# Patient Record
Sex: Female | Born: 1940 | Race: Black or African American | Hispanic: No | Smoking: Former smoker
Health system: Southern US, Community
[De-identification: ages and names within clinical notes are randomized; demographics above are authoritative.]

## PROBLEM LIST (undated history)

## (undated) DIAGNOSIS — I10 Essential (primary) hypertension: Secondary | ICD-10-CM

## (undated) DIAGNOSIS — M1990 Unspecified osteoarthritis, unspecified site: Secondary | ICD-10-CM

## (undated) DIAGNOSIS — R011 Cardiac murmur, unspecified: Secondary | ICD-10-CM

## (undated) DIAGNOSIS — J45909 Unspecified asthma, uncomplicated: Secondary | ICD-10-CM

## (undated) HISTORY — PX: ABDOMINAL HYSTERECTOMY: SHX81

---

## 2003-12-27 ENCOUNTER — Emergency Department: Payer: Self-pay | Admitting: Emergency Medicine

## 2005-03-14 ENCOUNTER — Emergency Department: Payer: Self-pay | Admitting: Emergency Medicine

## 2005-10-28 ENCOUNTER — Emergency Department: Payer: Self-pay | Admitting: Unknown Physician Specialty

## 2006-09-01 ENCOUNTER — Emergency Department: Payer: Self-pay | Admitting: Emergency Medicine

## 2006-09-01 ENCOUNTER — Other Ambulatory Visit: Payer: Self-pay

## 2006-09-03 ENCOUNTER — Emergency Department: Payer: Self-pay | Admitting: Emergency Medicine

## 2006-09-03 ENCOUNTER — Other Ambulatory Visit: Payer: Self-pay

## 2007-04-19 ENCOUNTER — Other Ambulatory Visit: Payer: Self-pay

## 2007-04-19 ENCOUNTER — Emergency Department: Payer: Self-pay | Admitting: Emergency Medicine

## 2007-07-04 ENCOUNTER — Ambulatory Visit: Payer: Self-pay | Admitting: Orthopedic Surgery

## 2008-08-12 ENCOUNTER — Emergency Department: Payer: Self-pay | Admitting: Unknown Physician Specialty

## 2009-07-02 ENCOUNTER — Emergency Department: Payer: Self-pay | Admitting: Emergency Medicine

## 2009-11-27 ENCOUNTER — Emergency Department: Payer: Self-pay | Admitting: Emergency Medicine

## 2010-05-30 ENCOUNTER — Emergency Department: Payer: Self-pay | Admitting: Emergency Medicine

## 2011-01-12 ENCOUNTER — Emergency Department: Payer: Self-pay | Admitting: *Deleted

## 2011-03-01 ENCOUNTER — Inpatient Hospital Stay: Payer: Self-pay | Admitting: Family Medicine

## 2011-03-01 LAB — CBC
HCT: 26.6 % — ABNORMAL LOW (ref 35.0–47.0)
HGB: 8.6 g/dL — ABNORMAL LOW (ref 12.0–16.0)
MCH: 26.9 pg (ref 26.0–34.0)
MCV: 84 fL (ref 80–100)
RBC: 3.19 10*6/uL — ABNORMAL LOW (ref 3.80–5.20)
RDW: 14.7 % — ABNORMAL HIGH (ref 11.5–14.5)
WBC: 10.9 10*3/uL (ref 3.6–11.0)

## 2011-03-01 LAB — BASIC METABOLIC PANEL
Anion Gap: 10 (ref 7–16)
Chloride: 104 mmol/L (ref 98–107)
Co2: 28 mmol/L (ref 21–32)
Creatinine: 1.05 mg/dL (ref 0.60–1.30)
EGFR (African American): >60
EGFR (Non-African Amer.): 55 — ABNORMAL LOW
Glucose: 85 mg/dL (ref 65–99)
Sodium: 142 mmol/L (ref 136–145)

## 2011-03-01 LAB — TROPONIN I: Troponin-I: 0.21 ng/mL — ABNORMAL HIGH

## 2011-03-01 LAB — HEPATIC FUNCTION PANEL A (ARMC)
Albumin: 2.9 g/dL — ABNORMAL LOW (ref 3.4–5.0)
Alkaline Phosphatase: 74 U/L (ref 50–136)
Bilirubin, Direct: 0.2 mg/dL (ref 0.00–0.20)
Total Protein: 6.6 g/dL (ref 6.4–8.2)

## 2011-03-01 LAB — CK TOTAL AND CKMB (NOT AT ARMC)
CK, Total: 37 U/L (ref 21–215)
CK-MB: <0.5 ng/mL — ABNORMAL LOW (ref 0.5–3.6)

## 2011-03-02 LAB — BASIC METABOLIC PANEL
Anion Gap: 12 (ref 7–16)
BUN: 10 mg/dL (ref 7–18)
Chloride: 105 mmol/L (ref 98–107)
Creatinine: 0.95 mg/dL (ref 0.60–1.30)
EGFR (African American): >60
EGFR (Non-African Amer.): >60
Potassium: 3.5 mmol/L (ref 3.5–5.1)
Sodium: 142 mmol/L (ref 136–145)

## 2011-03-02 LAB — CBC WITH DIFFERENTIAL/PLATELET
Basophil #: 0 10*3/uL (ref 0.0–0.1)
Eosinophil #: 0.1 10*3/uL (ref 0.0–0.7)
Eosinophil %: 1 %
HGB: 7.7 g/dL — ABNORMAL LOW (ref 12.0–16.0)
MCH: 27.1 pg (ref 26.0–34.0)
MCHC: 32.3 g/dL (ref 32.0–36.0)
Monocyte #: 1.4 10*3/uL — ABNORMAL HIGH (ref 0.0–0.7)
Neutrophil #: 5.4 10*3/uL (ref 1.4–6.5)
Neutrophil %: 60.1 %
Platelet: 231 10*3/uL (ref 150–440)
RBC: 2.83 10*6/uL — ABNORMAL LOW (ref 3.80–5.20)
WBC: 8.9 10*3/uL (ref 3.6–11.0)

## 2011-03-02 LAB — LIPID PANEL: VLDL Cholesterol, Calc: 11 mg/dL (ref 5–40)

## 2011-03-02 LAB — IRON AND TIBC
Iron Saturation: 15 %
Iron: 24 ug/dL — ABNORMAL LOW (ref 50–170)
Unbound Iron-Bind.Cap.: 133 ug/dL

## 2011-03-02 LAB — TROPONIN I: Troponin-I: 0.15 ng/mL — ABNORMAL HIGH

## 2011-03-02 LAB — MAGNESIUM: Magnesium: 1.6 mg/dL — ABNORMAL LOW

## 2011-03-02 LAB — FOLATE: Folic Acid: 6.3 ng/mL (ref 3.1–100.0)

## 2011-03-03 LAB — BASIC METABOLIC PANEL
BUN: 8 mg/dL (ref 7–18)
Chloride: 106 mmol/L (ref 98–107)
Co2: 27 mmol/L (ref 21–32)
Creatinine: 1.06 mg/dL (ref 0.60–1.30)
EGFR (Non-African Amer.): 55 — ABNORMAL LOW
Potassium: 3.7 mmol/L (ref 3.5–5.1)
Sodium: 143 mmol/L (ref 136–145)

## 2011-03-03 LAB — CBC WITH DIFFERENTIAL/PLATELET
Basophil %: 0 %
Eosinophil %: 1.9 %
HGB: 7.7 g/dL — ABNORMAL LOW (ref 12.0–16.0)
Lymphocyte #: 1.9 10*3/uL (ref 1.0–3.6)
Lymphocyte %: 25.3 %
MCH: 26.6 pg (ref 26.0–34.0)
MCHC: 31.5 g/dL — ABNORMAL LOW (ref 32.0–36.0)
MCV: 84 fL (ref 80–100)
Monocyte #: 1 10*3/uL — ABNORMAL HIGH (ref 0.0–0.7)
Monocyte %: 13.6 %
Neutrophil %: 59.2 %
RDW: 14.8 % — ABNORMAL HIGH (ref 11.5–14.5)
WBC: 7.4 10*3/uL (ref 3.6–11.0)

## 2011-03-04 LAB — BASIC METABOLIC PANEL
Anion Gap: 8 (ref 7–16)
Calcium, Total: 9 mg/dL (ref 8.5–10.1)
Chloride: 105 mmol/L (ref 98–107)
Co2: 27 mmol/L (ref 21–32)
Creatinine: 1.13 mg/dL (ref 0.60–1.30)
EGFR (Non-African Amer.): 51 — ABNORMAL LOW
Osmolality: 277 (ref 275–301)
Potassium: 3.9 mmol/L (ref 3.5–5.1)
Sodium: 140 mmol/L (ref 136–145)

## 2011-03-04 LAB — CBC WITH DIFFERENTIAL/PLATELET
Basophil #: 0 10*3/uL (ref 0.0–0.1)
Basophil %: 0 %
HCT: 24.9 % — ABNORMAL LOW (ref 35.0–47.0)
HGB: 7.9 g/dL — ABNORMAL LOW (ref 12.0–16.0)
Lymphocyte %: 23.2 %
MCV: 84 fL (ref 80–100)
Monocyte %: 11.5 %
Neutrophil #: 4.8 10*3/uL (ref 1.4–6.5)
RDW: 14.6 % — ABNORMAL HIGH (ref 11.5–14.5)
WBC: 7.6 10*3/uL (ref 3.6–11.0)

## 2011-03-07 LAB — CULTURE, BLOOD (SINGLE)

## 2011-04-14 ENCOUNTER — Ambulatory Visit: Payer: Self-pay | Admitting: Gastroenterology

## 2011-06-17 ENCOUNTER — Ambulatory Visit: Payer: Self-pay | Admitting: Gastroenterology

## 2011-11-24 ENCOUNTER — Emergency Department: Payer: Self-pay | Admitting: Emergency Medicine

## 2011-11-24 LAB — BASIC METABOLIC PANEL
Anion Gap: 9 (ref 7–16)
BUN: 17 mg/dL (ref 7–18)
Chloride: 109 mmol/L — ABNORMAL HIGH (ref 98–107)
Co2: 26 mmol/L (ref 21–32)
Creatinine: 1.1 mg/dL (ref 0.60–1.30)
Potassium: 3.9 mmol/L (ref 3.5–5.1)
Sodium: 144 mmol/L (ref 136–145)

## 2011-11-24 LAB — URINALYSIS, COMPLETE
Bilirubin,UR: NEGATIVE
Blood: NEGATIVE
Ketone: NEGATIVE
Protein: NEGATIVE
RBC,UR: 1 /HPF (ref 0–5)
Specific Gravity: 1.023 (ref 1.003–1.030)
WBC UR: 1 /HPF (ref 0–5)

## 2011-11-24 LAB — CBC
HGB: 10.9 g/dL — ABNORMAL LOW (ref 12.0–16.0)
MCH: 26.8 pg (ref 26.0–34.0)
MCHC: 33.4 g/dL (ref 32.0–36.0)
MCV: 80 fL (ref 80–100)
RBC: 4.06 10*6/uL (ref 3.80–5.20)

## 2012-08-09 ENCOUNTER — Emergency Department: Payer: Self-pay | Admitting: Emergency Medicine

## 2012-08-09 LAB — URINALYSIS, COMPLETE
Bacteria: NONE SEEN
Bilirubin,UR: NEGATIVE
Blood: NEGATIVE
Glucose,UR: NEGATIVE mg/dL (ref 0–75)
Leukocyte Esterase: NEGATIVE
Nitrite: NEGATIVE
Protein: 30
RBC,UR: 1 /HPF (ref 0–5)
Specific Gravity: 1.025 (ref 1.003–1.030)
Squamous Epithelial: 12
WBC UR: 1 /HPF (ref 0–5)

## 2012-08-09 LAB — BASIC METABOLIC PANEL
Anion Gap: 7 (ref 7–16)
BUN: 15 mg/dL (ref 7–18)
Calcium, Total: 9.1 mg/dL (ref 8.5–10.1)
Chloride: 108 mmol/L — ABNORMAL HIGH (ref 98–107)
Co2: 22 mmol/L (ref 21–32)
EGFR (Non-African Amer.): 52 — ABNORMAL LOW
Glucose: 90 mg/dL (ref 65–99)
Osmolality: 274 (ref 275–301)
Potassium: 3.6 mmol/L (ref 3.5–5.1)
Sodium: 137 mmol/L (ref 136–145)

## 2012-08-09 LAB — CBC WITH DIFFERENTIAL/PLATELET
Basophil #: 0.1 10*3/uL (ref 0.0–0.1)
Basophil %: 0.8 %
Eosinophil %: 0.6 %
HGB: 11.4 g/dL — ABNORMAL LOW (ref 12.0–16.0)
Lymphocyte #: 2.2 10*3/uL (ref 1.0–3.6)
MCV: 81 fL (ref 80–100)
Monocyte #: 0.6 x10 3/mm (ref 0.2–0.9)
Neutrophil #: 3.8 10*3/uL (ref 1.4–6.5)
Neutrophil %: 57.1 %
Platelet: 187 10*3/uL (ref 150–440)
RBC: 4.3 10*6/uL (ref 3.80–5.20)
WBC: 6.7 10*3/uL (ref 3.6–11.0)

## 2012-10-19 ENCOUNTER — Emergency Department: Payer: Self-pay | Admitting: Emergency Medicine

## 2012-12-30 ENCOUNTER — Emergency Department: Payer: Self-pay | Admitting: Emergency Medicine

## 2013-01-04 ENCOUNTER — Inpatient Hospital Stay: Payer: Self-pay | Admitting: Psychiatry

## 2013-01-04 LAB — COMPREHENSIVE METABOLIC PANEL
Albumin: 3.7 g/dL (ref 3.4–5.0)
Anion Gap: 4 — ABNORMAL LOW (ref 7–16)
Creatinine: 1.02 mg/dL (ref 0.60–1.30)
EGFR (African American): >60
EGFR (Non-African Amer.): 55 — ABNORMAL LOW
Glucose: 74 mg/dL (ref 65–99)
Osmolality: 282 (ref 275–301)
Potassium: 3.6 mmol/L (ref 3.5–5.1)
SGPT (ALT): 11 U/L — ABNORMAL LOW (ref 12–78)
Sodium: 141 mmol/L (ref 136–145)

## 2013-01-04 LAB — URINALYSIS, COMPLETE
Bacteria: NONE SEEN
Bilirubin,UR: NEGATIVE
Blood: NEGATIVE
Ketone: NEGATIVE
Nitrite: NEGATIVE
Ph: 6 (ref 4.5–8.0)
Protein: NEGATIVE
RBC,UR: <1 /HPF (ref 0–5)
Squamous Epithelial: <1

## 2013-01-04 LAB — CBC
HCT: 34.2 % — ABNORMAL LOW (ref 35.0–47.0)
MCH: 26.4 pg (ref 26.0–34.0)
RDW: 15 % — ABNORMAL HIGH (ref 11.5–14.5)
WBC: 6.3 10*3/uL (ref 3.6–11.0)

## 2013-01-04 LAB — DRUG SCREEN, URINE
Barbiturates, Ur Screen: NEGATIVE (ref ?–200)
Benzodiazepine, Ur Scrn: NEGATIVE (ref ?–200)
Cocaine Metabolite,Ur ~~LOC~~: NEGATIVE (ref ?–300)
Opiate, Ur Screen: NEGATIVE (ref ?–300)
Tricyclic, Ur Screen: NEGATIVE (ref ?–1000)

## 2013-01-04 LAB — ETHANOL: Ethanol %: 0.277 % — ABNORMAL HIGH (ref 0.000–0.080)

## 2013-01-04 LAB — TSH: Thyroid Stimulating Horm: 0.767 u[IU]/mL

## 2013-01-05 LAB — ETHANOL
Ethanol %: 0.144 % — ABNORMAL HIGH (ref 0.000–0.080)
Ethanol: 144 mg/dL

## 2013-01-26 ENCOUNTER — Inpatient Hospital Stay: Payer: Self-pay | Admitting: Internal Medicine

## 2013-01-26 LAB — BASIC METABOLIC PANEL
Anion Gap: 4 — ABNORMAL LOW (ref 7–16)
Chloride: 108 mmol/L — ABNORMAL HIGH (ref 98–107)
Creatinine: 1.08 mg/dL (ref 0.60–1.30)
EGFR (African American): 60 — ABNORMAL LOW
EGFR (Non-African Amer.): 52 — ABNORMAL LOW
Glucose: 96 mg/dL (ref 65–99)
Osmolality: 281 (ref 275–301)
Potassium: 3.8 mmol/L (ref 3.5–5.1)

## 2013-01-26 LAB — CBC WITH DIFFERENTIAL/PLATELET
Basophil %: 0.3 %
Eosinophil %: 0.6 %
Lymphocyte #: 2 10*3/uL (ref 1.0–3.6)
Lymphocyte %: 25.1 %
MCH: 26.4 pg (ref 26.0–34.0)
MCV: 81 fL (ref 80–100)
Monocyte #: 0.7 x10 3/mm (ref 0.2–0.9)
Monocyte %: 8.5 %
Neutrophil #: 5.3 10*3/uL (ref 1.4–6.5)
Neutrophil %: 65.5 %
Platelet: 169 10*3/uL (ref 150–440)
WBC: 8 10*3/uL (ref 3.6–11.0)

## 2013-01-26 LAB — URINALYSIS, COMPLETE
Bacteria: NONE SEEN
Bilirubin,UR: NEGATIVE
Leukocyte Esterase: NEGATIVE
Nitrite: NEGATIVE
Ph: 7 (ref 4.5–8.0)
Protein: NEGATIVE
RBC,UR: 1 /HPF (ref 0–5)
Specific Gravity: 1.014 (ref 1.003–1.030)
Squamous Epithelial: 4

## 2013-01-27 LAB — CK TOTAL AND CKMB (NOT AT ARMC)
CK, Total: 45 U/L (ref 21–215)
CK-MB: 0.9 ng/mL (ref 0.5–3.6)

## 2013-01-27 LAB — TROPONIN I
Troponin-I: <0.02 ng/mL
Troponin-I: <0.02 ng/mL

## 2013-01-28 LAB — BASIC METABOLIC PANEL
Anion Gap: 7 (ref 7–16)
BUN: 15 mg/dL (ref 7–18)
Calcium, Total: 8.7 mg/dL (ref 8.5–10.1)
Chloride: 105 mmol/L (ref 98–107)
Creatinine: 0.95 mg/dL (ref 0.60–1.30)
Glucose: 87 mg/dL (ref 65–99)
Osmolality: 276 (ref 275–301)

## 2013-05-08 ENCOUNTER — Emergency Department: Payer: Self-pay | Admitting: Emergency Medicine

## 2013-06-19 ENCOUNTER — Emergency Department: Payer: Self-pay | Admitting: Emergency Medicine

## 2013-08-17 ENCOUNTER — Emergency Department: Payer: Self-pay | Admitting: Emergency Medicine

## 2013-08-17 LAB — COMPREHENSIVE METABOLIC PANEL
ANION GAP: 9 (ref 7–16)
AST: 12 U/L — AB (ref 15–37)
Albumin: 3.5 g/dL (ref 3.4–5.0)
Alkaline Phosphatase: 74 U/L
BUN: 14 mg/dL (ref 7–18)
Bilirubin,Total: 0.8 mg/dL (ref 0.2–1.0)
CHLORIDE: 104 mmol/L (ref 98–107)
CO2: 26 mmol/L (ref 21–32)
Calcium, Total: 8.8 mg/dL (ref 8.5–10.1)
Creatinine: 0.88 mg/dL (ref 0.60–1.30)
EGFR (Non-African Amer.): >60
Glucose: 72 mg/dL (ref 65–99)
Osmolality: 277 (ref 275–301)
POTASSIUM: 3.4 mmol/L — AB (ref 3.5–5.1)
SGPT (ALT): 11 U/L — ABNORMAL LOW (ref 12–78)
Sodium: 139 mmol/L (ref 136–145)
TOTAL PROTEIN: 6.9 g/dL (ref 6.4–8.2)

## 2013-08-17 LAB — TROPONIN I: Troponin-I: <0.02 ng/mL

## 2013-08-17 LAB — CBC WITH DIFFERENTIAL/PLATELET
Basophil #: 0.1 10*3/uL (ref 0.0–0.1)
Basophil %: 0.8 %
EOS ABS: 0.1 10*3/uL (ref 0.0–0.7)
Eosinophil %: 1.1 %
HCT: 31.7 % — ABNORMAL LOW (ref 35.0–47.0)
HGB: 10.5 g/dL — ABNORMAL LOW (ref 12.0–16.0)
LYMPHS ABS: 1.4 10*3/uL (ref 1.0–3.6)
Lymphocyte %: 14.8 %
MCH: 26.9 pg (ref 26.0–34.0)
MCHC: 33 g/dL (ref 32.0–36.0)
MCV: 81 fL (ref 80–100)
MONOS PCT: 7.3 %
Monocyte #: 0.7 x10 3/mm (ref 0.2–0.9)
NEUTROS PCT: 76 %
Neutrophil #: 7.4 10*3/uL — ABNORMAL HIGH (ref 1.4–6.5)
Platelet: 180 10*3/uL (ref 150–440)
RBC: 3.9 10*6/uL (ref 3.80–5.20)
RDW: 14.1 % (ref 11.5–14.5)
WBC: 9.7 10*3/uL (ref 3.6–11.0)

## 2013-11-04 ENCOUNTER — Emergency Department: Payer: Self-pay | Admitting: Student

## 2013-11-11 ENCOUNTER — Emergency Department: Payer: Self-pay | Admitting: Emergency Medicine

## 2013-11-11 LAB — COMPREHENSIVE METABOLIC PANEL
ALK PHOS: 96 U/L
ALT: 11 U/L — AB
ANION GAP: 7 (ref 7–16)
Albumin: 3.2 g/dL — ABNORMAL LOW (ref 3.4–5.0)
BILIRUBIN TOTAL: 0.7 mg/dL (ref 0.2–1.0)
BUN: 17 mg/dL (ref 7–18)
CALCIUM: 8.3 mg/dL — AB (ref 8.5–10.1)
CHLORIDE: 105 mmol/L (ref 98–107)
CO2: 29 mmol/L (ref 21–32)
CREATININE: 1.05 mg/dL (ref 0.60–1.30)
GFR CALC NON AF AMER: 53 — AB
Glucose: 103 mg/dL — ABNORMAL HIGH (ref 65–99)
Osmolality: 283 (ref 275–301)
Potassium: 3.6 mmol/L (ref 3.5–5.1)
SGOT(AST): 15 U/L (ref 15–37)
Sodium: 141 mmol/L (ref 136–145)
TOTAL PROTEIN: 6.7 g/dL (ref 6.4–8.2)

## 2013-11-11 LAB — CBC
HCT: 34 % — AB (ref 35.0–47.0)
HGB: 10.5 g/dL — ABNORMAL LOW (ref 12.0–16.0)
MCH: 24.9 pg — ABNORMAL LOW (ref 26.0–34.0)
MCHC: 30.9 g/dL — ABNORMAL LOW (ref 32.0–36.0)
MCV: 80 fL (ref 80–100)
Platelet: 198 10*3/uL (ref 150–440)
RBC: 4.23 10*6/uL (ref 3.80–5.20)
RDW: 14.6 % — ABNORMAL HIGH (ref 11.5–14.5)
WBC: 6.3 10*3/uL (ref 3.6–11.0)

## 2013-11-11 LAB — URIC ACID: Uric Acid: 5.7 mg/dL (ref 2.6–6.0)

## 2014-06-15 NOTE — Discharge Summary (Signed)
PATIENT NAME:  Amber Vasquez, Amber Vasquez MR#:  [id number] DATE OF BIRTH:  22-Jun-1940  DATE OF ADMISSION:  01/04/2013 DATE OF DISCHARGE:  01/06/2013  HOSPITAL COURSE:  See dictated history and physical for details of admission.  A 74 year old woman with a history of no prior mental health treatment who was admitted after coming to the Emergency Room intoxicated and at the time making suicidal statements.  After sobering up the patient completely denied any suicidal ideation.  She claimed that she could not remember making any of the wild statements that she had made in the Emergency Room.  She minimized the degree that her drinking was a problem.  She did not show any signs of alcohol withdrawal.  She was not tachycardic, shaky, did not have a seizure.  Did not appear delirious.  She was not interested in inpatient alcohol abuse treatment and again she tried to minimize her alcohol use problem.  She completely denied other psychiatric symptoms.  She was referred however to outpatient follow[facility] with the advice that she seek counseling about her mood stability.  She was not started on any psychiatric medicine.  One medical point was that her blood pressure remained extremely high throughout her hospital stay.  This did not seem to me to be a symptom of alcohol withdrawal.  She gave a history of hypertension and said she had stopped taking her medicine years ago because she thought she was told she did not need them anymore.  I suspect she has been walking around with a high blood pressure ever since then.  She was put on metoprolol and also a clonidine patch, but still had high blood pressure, especially the systolic number.  She was counseled about the risks of high blood pressure and encouraged to follow up with her primary care doctor and she said she would.   MENTAL STATUS EXAMINATION AT DISCHARGE:  Casually dressed, neatly groomed woman, looks her stated age, cooperative with the interview.  Good  eye contact, normal psychomotor activity.  Speech normal rate, tone and volume.  Affect euthymic, reactive, appropriate.  Mood stated as fine.  Thoughts lucid.  No evidence of loosening of associations or delusions.  Denies auditory or visual hallucinations.  Denies suicidal or homicidal ideation.  Shows adequate and somewhat improved judgment and insight.  Normal intelligence.  Alert and oriented x 4.   DISCHARGE MEDICATIONS:  Catapres 0.1 mg in 24 hour transdermal film one patch per week.  Metoprolol 25 mg once per day.   LABORATORY RESULTS:  Chemistry panel showed just a slightly elevated chloride and low ALT.  Hemoglobin and hematocrit slightly low.  Cervical spine CT and head CT both normal.  Drug screen negative.  Alcohol level 277 on admission.  Thyroid test within normal limits.   DIAGNOSIS, PRINCIPAL AND PRIMARY:  AXIS I:  Alcohol abuse.   SECONDARY DIAGNOSES: AXIS I:  Substance-induced mood disorder.  AXIS II:  Deferred.  AXIS III:  Hypertension.  AXIS IV:  Severe from taking care of her family.  AXIS V:  Functioning at time of discharge 56.     ____________________________ Audery AmelJohn T. Elmor Kost, MD jtc:ea D: 01/06/2013 22:34:24 ET T: 01/07/2013 02:06:12 ET JOB#: [id number]  cc: Audery AmelJohn T. Chanell Nadeau, MD, <Dictator> Audery AmelJOHN T Jacqulin Brandenburger MD ELECTRONICALLY SIGNED 01/08/2013 18:50

## 2014-06-15 NOTE — H&P (Signed)
PATIENT NAME:  Amber Vasquez, CLOSSON MR#:  [id number] DATE OF BIRTH:  11-11-1940  DATE OF ADMISSION:  01/26/2013  REFERRING PHYSICIAN: Dr. Sharma Covert.   PRIMARY CARE PHYSICIAN: Dr. Burnadette Pop.  CHIEF COMPLAINT: Headache.   A 74 year old [place] American female, with past medical history of hypertension, is presenting with headache. She describes this as an intermittent headache, lasting 2 to 3 days in duration, though progressively worsening and worse today. She previously described this headache as a global sensation; no relief with rest. Today, she found no relieving factors. She describes her headache as global and nonradiating, 6 out of 10 in intensity, sharp, no relieving or worsening factors, with associated lightheadedness, blurred vision and chest pain. She described the chest pain as over the left side and burning sensation without associated shortness of breath, palpitations, nausea or diaphoresis. Given her symptoms, she checked her blood pressure and noted systolic blood pressure to be around 209 with diastolic in the high 90s to 100. With the above symptoms and elevated blood pressure, she presented to the hospital for further workup and evaluation. Of note, she has taken her metoprolol today at around 2:00 p.m.  She was previously prescribed a clonidine patch, however, has not been taking it secondary to financial issues. Currently, she is without complaints.     REVIEW OF SYSTEMS:   CONSTITUTIONAL: Denies fever, fatigue, weakness, pain.  EYES: Denies current blurred vision, double vision, eye pain.  EARS, NOSE, THROAT: Denies tinnitus, ear pain, hearing loss.  RESPIRATORY: Denies cough, wheeze, shortness of breath.  CARDIOVASCULAR: Denies chest pain, palpitations, edema.  GASTROINTESTINAL:  Denies nausea, vomiting, diarrhea, abdominal pain.  GENITOURINARY: Denies dysuria or hematuria.  ENDOCRINE: Denies nocturia or polyuria.  HEMATOLOGIC AND LYMPHATICS: Denies easy bruising or bleeding.  SKIN:  Denies any rash or lesion.  MUSCULOSKELETAL: Denies any pain in her neck, back, shoulders, knees or hips or any arthritic symptoms.  NEUROLOGIC: Positive for headache but has greatly diminished. Denies any paralysis or paresthesias.  PSYCHIATRIC: Denies any anxiety or depressive symptoms. Otherwise, full review of systems performed by me is negative.   PAST MEDICAL HISTORY: Hypertension and alcohol abuse.   SOCIAL HISTORY: Positive for tobacco usage, smoking less than 1 pack daily, as well as alcohol use, drinking 1 to 2 drinks of gin daily.   FAMILY HISTORY: Positive for hypertension and coronary artery disease.   ALLERGIES: No known allergies.   HOME MEDICATIONS: Include metoprolol 25 mg extended release p.o. daily.   PHYSICAL EXAMINATION: VITAL SIGNS: Temperature 98.1, heart rate 76, respirations 20, blood pressure on arrival 228/95, saturating 100% on room air. Current blood pressure 170/86. Weight 59 kg, BMI 19.2.  GENERAL: Well-nourished, well-developed, African American female who is currently in no acute distress.  HEAD: Normocephalic, atraumatic.  EYES:  Pupils equal, round and reactive to light. Extraocular muscles intact. No scleral icterus.  MOUTH: Moist mucous membranes. Dentition intact. No abscess noted.  EAR, NOSE, THROAT:  Clear without exudates. No external lesions.  NECK: Supple. No thyromegaly. No nodules. No JVD.  PULMONARY: Clear to auscultation bilaterally. No wheezes, rales or rhonchi. No use of accessory muscles. Good respiratory effort.  CHEST: Nontender to palpation.  CARDIOVASCULAR: S1, S2, regular rate and rhythm. No murmurs, rubs or gallops. No edema. Pedal pulses 2+ bilaterally.  GASTROINTESTINAL: Soft, nontender, nondistended. No masses. Positive bowel sounds. No hepatosplenomegaly.  MUSCULOSKELETAL: No swelling, clubbing or edema. Range of motion full in all extremities.  NEUROLOGIC: Cranial nerves II through XII intact. Sensation intact. Reflexes intact.  Pronator drift within normal limits. Gait deferred at this time. Strength is 5/5 in all extremities, including proximal and distal flexion and extension.  SKIN: No ulcerations, lesions, rashes or cyanosis. Skin warm, dry. Turgor is intact.  PSYCHIATRIC:  Mood and affect is within normal limits. Awake, alert and oriented x 3.  Insight and judgment intact.   LABORATORY DATA: Sodium 140, potassium 3.8, chloride 108, bicarb 28, BUN 18, creatinine 1.08, glucose 96. Troponin I less than 0.02. WBC 8, hemoglobin 11.4, platelets of 169. Urinalysis negative for evidence of infection. CT head performed revealing no acute intracranial process. EKG performed, normal sinus rhythm, heart rate 72. Minimal voltage criteria for left ventricular hypertrophy.   ASSESSMENT AND PLAN: A 74 year old [place] American female with history of hypertension, who has presented with intermittent headache for 2 to 3 days, found to be in hypertensive urgency.  1.  Hypertensive urgency, started on nitroglycerin drip in the Emergency Department. Her symptoms have improved. We will continue nitroglycerin drip for goal systolic blood pressure of less than 180, then wean off.  She will need additional p.o. agents.  She  is unable to afford her clonidine patch. We will start hydrochlorothiazide.  2.  Chest pain. EKG and enzymes within normal limits. We will trend cardiac enzymes. She has been given aspirin thus far.  3.  Anemia, normocytic. No indicator for transfusion at this time.  4.  Deep venous thrombosis prophylaxis with heparin subQ.   The patient is FULL CODE.   TIME SPENT: 45 minutes.    ____________________________ Cletis Athensavid K. Aarthi Uyeno, MD dkh:dmm D: 01/26/2013 20:35:00 ET T: 01/26/2013 20:50:13 ET JOB#: [id number]  cc: Cletis Athensavid K. Terricka Onofrio, MD, <Dictator> Kiwanna Spraker Synetta ShadowK Cruz Devilla MD ELECTRONICALLY SIGNED 01/27/2013 2:39

## 2014-06-15 NOTE — H&P (Signed)
PATIENT NAME:  Amber Vasquez, Amber Vasquez MR#:  [id number] DATE OF BIRTH:  Aug 28, 1940  DATE OF ADMISSION:  01/04/2013  DATE OF CONSULTATION: 01/05/2013  IDENTIFYING INFORMATION AND CHIEF COMPLAINT: A 74 year old woman came to the Emergency Room last night, intoxicated, making suicidal statements.   CHIEF COMPLAINT: Today, "I've had too much to drink and said something stupid."   HISTORY OF PRESENT ILLNESS: Information obtained from the patient and the chart. According to the chart when she came to the Emergency Room last night, she was intoxicated with a blood alcohol level almost 300. She was quoted as saying that she wanted to kill herself and that she was upset because there was somebody at work who was treating her badly.   Today, the patient tells me that she had a little bit more to drink than usual yesterday because she got off work early. She got into an argument with her boyfriend and says that she then made some off-the-cuff statement about just going ahead and killing herself. She totally denies that she was having any actual thought about killing herself. She claims to not be able to remember anything that she said in the Emergency Room as far as suicide. She admits that there is a person at work who has been of some annoyance to her but totally denies that it is a big stress for her or that it causes her to have any thoughts about killing herself. Denies that her mood is depressed consistently, denies sleep problems, appetite problems, hopelessness, or helplessness. She claims that her alcohol use is about a drink or two every few days, although last night, she said that she was a "functioning alcoholic." Today she denies even having that much of an alcohol problem and says no one has ever told her that her alcohol use was an issue in the past.   PAST PSYCHIATRIC HISTORY: Never been in the hospital for detox before. Says she has never gotten any treatment for alcohol abuse. No psychiatric treatment of  any sort. Not on antidepressant medicine. Denies any history of suicide attempts or psychotic symptoms.   PAST MEDICAL HISTORY: The patient has had a colonoscopy and has had pneumonia in the past. She has hypertension but says she was told to stop taking her medicine some time ago and so she no longer takes medicine for it.   SUBSTANCE ABUSE HISTORY: She minimizes this completely. Claims that she drinks only a little bit and that it has not been impairing to her in any way. Denies any history of seizures or withdrawal. Says no one has ever suggested to her that it was a problem.   SOCIAL HISTORY: Lives alone but has a boyfriend who visits her regularly. 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 Has family support. She does have a lot of stress in her life in the form of taking care of her 73 year old father and also having a son who apparently has a terminal cancer and is having to live in a facility of some sort, so that she feels responsible for both of them. She has grandchildren she sees regularly. The son who is evidently dying is her only child.   CURRENT MEDICATIONS: She denies that she has been taking anything. She cannot remember what the blood pressure medicine that she used to take was.   ALLERGIES: No known drug allergies.   REVIEW OF SYSTEMS: Today she denies depression. Denies suicidal ideation. Denies hallucinations. Denies shakiness or nausea. Denies having any symptoms essentially at all.  FAMILY HISTORY: Denies any history of substance abuse or mental health problems in her family.   MENTAL STATUS EXAMINATION: Elderly woman, looks older than her stated age. Cooperative with the interview. Eye contact a little skittish. Psychomotor activity normal. No sign of a tremor. Speech normal in rate, tone and volume. Affect is slightly anxious. Speech is normal rate, tone and volume. She very much is dismissive of any symptoms. Insight possibly impaired. Judgment currently improved. Intelligence  normal. No sign of obvious dementia.   PHYSICAL EXAMINATION:  GENERAL: Quite thin. She has diffusely dry skin. No acute lesions. She has poor dentition. Oral mucosa is dry.  HEENT: Pupils equal and reactive. Face symmetric.  MUSCULOSKELETAL: Full range of motion at all extremities. Gait within normal limits. Strength normal throughout. Reflexes normal throughout. Cranial nerves symmetric and normal.  LUNGS: Clear with no wheezes.  HEART: Regular rate and rhythm.  ABDOMEN: Soft, nontender, normal bowel sounds.  CURRENT VITAL SIGNS: Now blood pressure of 211/82, respirations 18, pulse 63, temperature 97.7.   LABORATORY RESULTS: Alcohol level in the Emergency Room last night, 277, came down by later last night to 144. Drug screen negative. TSH normal. Chemistry panel shows a low ALT, slightly elevated chloride. No other abnormalities.   Hematology panel: Low hematocrit and hemoglobin. Normal platelet count.   Urinalysis unremarkable.   ASSESSMENT: This is a 74 year old woman who was intoxicated and made suicidal statements. Today she is denying suicidal ideation and minimizing her drinking. She is not showing signs of withdrawal and her labs do not look particularly abnormal, all of which suggests that she may not have a very heavy alcohol problem; nevertheless, obviously it is a problem if she is voicing this kind of suicidal ideation.   TREATMENT PLAN: Worked on trying to improve her insight. Supportive and educational therapy. Reviewed medications. Reviewed blood pressure, especially. Encouraged her to attend groups. Continue detox protocol.   DIAGNOSIS, PRINCIPAL AND PRIMARY:  AXIS I: Alcohol abuse.   SECONDARY DIAGNOSES:  AXIS I: Substance-induced mood disorder.  AXIS II: Deferred.  AXIS III: Hypertension.  AXIS IV: Severe stress from her sick family.  AXIS V: Functioning at time of evaluation, 50.    ____________________________ Audery Amel, MD jtc:np D: 01/05/2013 17:39:27  ET T: 01/05/2013 19:24:53 ET JOB#: [id number]  cc: Audery Amel, MD, <Dictator> Audery Amel MD ELECTRONICALLY SIGNED 01/05/2013 23:19
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
## 2014-06-17 NOTE — Consult Note (Signed)
Brief Consult Note: Diagnosis: pneumonia.   Patient was seen by consultant.   Consult note dictated.   Discussed with Attending MD.   Comments: Appreciate consult for 74 y/o [place] woman for hemoptysis and anemia with abdominal pain today. Does relate history of epigastric discomfort, nausea, early satiety since last Thanksgiving. Reports weight loss of 20lb since then and 2 episodes of black tarry stools on last Thursday and Saturday. Has used BC powders q am for the last 2 years, no PPI or H2RA use at home. No history of colonscopy or EGD. Stool brown and heme negative on exam today. Assessment: likely blood loss from NSAID related GI bleeding. Would like to do EGD on patient- but will need cardiac clearance first due to recent EKG changes and slight troponin elevation. Dr Marva PandaSkulskie discussed this with Dr Burnadette PopLinthavong who is to contact cardiology  Electronic Signatures: Keturah BarreLondon, Chelsei Mcchesney H (NP)  (Signed 08-Jan-13 17:19)  Authored: Brief Consult Note   Last Updated: 08-Jan-13 17:19 by Keturah BarreLondon, Avyukt Cimo H (NP)

## 2014-06-17 NOTE — Discharge Summary (Signed)
PATIENT NAME:  Amber Vasquez, Neida M MR#:  [id number] DATE OF BIRTH:  [birth year]  DATE OF ADMISSION:  03/01/2011 DATE OF DISCHARGE:  03/04/2011  DISCHARGE DIAGNOSES:  1. Right-sided pneumonia.  2. Normocytic anemia.  3. Hypertension.   DISCHARGE MEDICATIONS:  1. Metoprolol 25 mg 1/2 tab p.o. twice a day.  2. Albuterol 90 mcg inhaler 2 puffs q.4 hours p.r.n. for wheezing.  3. Cheratussin AC 100/10 10 mL p.o. q.6 hours p.r.n. for cough.  4. Omeprazole 20 mg p.o. b.i.d.  5. Levaquin 500 mg p.o. daily x4 more days.  STOP MEDICATIONS: Do not take any NSAIDs.   CONSULTS: Cardiology per Dr. Gwen PoundsKowalski and GI per Dr. Marva PandaSkulskie.   PROCEDURES: The patient underwent an echocardiogram that showed normal LV function.   PERTINENT LABS ON DAY OF DISCHARGE: Patient's sodium was 140, potassium 3.9, BUN 8, creatinine 1.13. Hemoglobin was 7.9 with MCV of 84. White blood cell count 7.6, platelet count 335.   BRIEF HOSPITAL COURSE:  1. Right-sided pneumonia. The patient came in with acutely worsening cough and shortness of breath. Chest x-ray consistent with right side pneumonia. She was placed on IV Levaquin and breathing treatments as needed. She was transitioned over to orals after 48 hours being afebrile with a normal white blood cell count. She tolerated the medication fine without any issues.   2. Normocytic anemia. The patient was noted to be quite anemic with a hemoglobin below 8. She admits to having a history of chronic NSAID use. She was placed on PPI. She had an isolated episode of hemoptysis thought to be more due to the pneumonia and has not had any further issues since that episode. Denies any melena, hematochezia, or abdominal pain at this point. Dr. Marva PandaSkulskie was consulted who did recommend an EGD and colonoscopy but will hold off on this until the pneumonia is treated for at least two more days. The plan is to discharge her and have her see Dr. Marva PandaSkulskie and do a further work-up as an outpatient.  Cardiology was consulted because of her elevated troponins prior to doing any invasive procedure. Dr. Marva PandaSkulskie did not think that any further cardiac intervention was needed. He did not think that her elevated troponin was due to myocardial infarction. It was more due to ischemic demand.   3. Hypertension. She had been off her medications for quite some time. She came in with elevated blood pressure. Since we restarted her metoprolol her blood pressure has returned to normal. She will continue on the metoprolol as prescribed.   DISPOSITION: She is in stable condition to be discharged home. Follow-up with Dr. Marva PandaSkulskie in one week. Follow-up with Dr. Burnadette PopLinthavong in 1 to 2 weeks. She will need a recheck hemoglobin at that visit with Dr. Burnadette PopLinthavong.   ____________________________ Marisue IvanKanhka Tiya Schrupp, MD kl:rbg D: 03/04/2011 07:40:51 ET T: 03/05/2011 12:51:36 ET JOB#: [id number]  cc: Marisue IvanKanhka Sanaai Doane, MD, <Dictator> Marisue IvanKANHKA Destanee Bedonie MD ELECTRONICALLY SIGNED 03/10/2011 8:32

## 2014-06-17 NOTE — Consult Note (Signed)
Brief Consult Note: Diagnosis: known cad risk factors with acute anemia and bleeding as well as increase dyspnea and no current evidence of chest pain or true mi  Ehco with normal lv function elevated troponin suggests demand ischemia.   Patient was seen by consultant.   Consult note dictated.   Comments: continue supportive care for anemia and bleeding no further cardiac diagnositcs needed at this time continue medical mgt of cardiac risk factors proceed to gi workup at this time.  Electronic Signatures: Lamar BlinksKowalski, Osei Anger J (MD)  (Signed 08-Jan-13 17:07)  Authored: Brief Consult Note   Last Updated: 08-Jan-13 17:07 by Lamar BlinksKowalski, Ibtisam Benge J (MD)

## 2014-06-17 NOTE — Consult Note (Signed)
PATIENT NAME:  Amber Vasquez, Amber Vasquez MR#:  [id number] DATE OF BIRTH:  06/29/1940  DATE OF CONSULTATION:  03/08/2011  REFERRING PHYSICIAN:  Malachy Moanevainder Goli, MD CONSULTING PHYSICIAN:  Lamar BlinksBruce J. Kowalski, MD  REASON FOR CONSULTATION: Chest pain with elevated troponin.   HISTORY OF PRESENT ILLNESS: This is a 74 year old female with past history of hypertension who reports that she has had some higher blood pressures after running out of her blood pressure medication. She has had some cough and phlegm for the last month but no evidence of temperature or other issues. She has been having some chest type pain radiating into her back and into her abdomen with concerns of cardiovascular disease. She has had minimal elevation of troponin more consistent with demand ischemia rather than true acute myocardial infarction. The patient has had a significant history of alcohol use for which she claims that she is going to cut back. The patient has now been admitted for some cough, possible pneumonia, anemia, and hypertension. She is comfortable at this time on appropriate medications and antibiotics.   REVIEW OF SYSTEMS: The remainder review of systems is negative for vision change, ringing in the ears, hearing loss, cough, congestion, heartburn, nausea, vomiting, diarrhea, bloody stools, stomach pain, extremity pain, leg weakness, cramping of the buttocks, known blood clots, headaches, blackouts, dizzy spells, nosebleeds, congestion, trouble swallowing, frequent urination, urination at night, muscle weakness, numbness, anxiety, depression, skin lesions, or skin rashes.   PAST MEDICAL HISTORY:  1. Hypertension.  2. Hysterectomy.  FAMILY HISTORY: Mother had heart disease and hypertension.   SOCIAL HISTORY: She smokes cigarettes 1/2 pack per day and is drinking alcohol as well. She   DRUG ALLERGIES: No known drug allergies.  CURRENT MEDICATIONS: As listed.   PHYSICAL EXAMINATION:   VITAL SIGNS: Blood pressure 126/68  bilaterally and heart rate 72 upright, reclining, and regular.   GENERAL: She is a well appearing female in no acute distress.   HEENT: No icterus, thyromegaly, ulcers, hemorrhage, or xanthelasma.   HEART: Regular rate and rhythm. Normal S1 and S2 without murmur, gallop, or rub. Point of maximal impulse is normal size and placement. Carotid upstroke is normal without bruit. Jugular venous pressure is normal.   LUNGS: Clear to auscultation with normal respirations.   ABDOMEN: Soft and nontender without hepatosplenomegaly or masses. Abdominal aorta is normal size without bruit.   EXTREMITIES: 2+ bilateral pulses in dorsal, pedal, radial, and femoral arteries without lower extremity edema, cyanosis, clubbing, or ulcers.   NEUROLOGIC: She is oriented to time, place, and person with normal mood and affect.   ASSESSMENT: This is a 74 year old female with hypertension and elevated troponin consistent with demand ischemia, pneumonia, and anemia possibly secondary to tobacco use.   RECOMMENDATIONS:  1. No further cardiac diagnostics are necessary at this time due to no evidence of acute myocardial infarction. 2. Ambulate and follow for any worsening symptoms.  3. Continue hypertension control with beta blocker if able for heart rate and blood pressure control.         4. Further investigation of possible anemia and GI bleed as well as supportive treatment of pneumonia. ____________________________ Lamar BlinksBruce J. Kowalski, MD bjk:slb D: 03/12/2011 12:19:08 ET T: 03/12/2011 12:43:57 ET JOB#: [id number]  cc: Lamar BlinksBruce J. Kowalski, MD, <Dictator> Lamar BlinksBRUCE J KOWALSKI MD ELECTRONICALLY SIGNED 04/10/2011 14:26

## 2014-06-17 NOTE — Consult Note (Signed)
Chief Complaint:   Subjective/Chief Complaint please see full GI consult.  patient admitted with cough and  pneumonia, found with marked anemia.  Recommend egd and colonoscopy when clinically feasible.  Ecg changes form previous noted, with abnormal cardiac enzymes.  Appreciate cardiology assistance.  Cardiology input noted, would want another day or 2 of antibiotic treatment of pneumonia prior to going forward with sedated proceedure. Following.   Electronic Signatures: Barnetta ChapelSkulskie, Martin (MD)  (Signed 08-Jan-13 17:47)  Authored: Chief Complaint   Last Updated: 08-Jan-13 17:47 by Barnetta ChapelSkulskie, Martin (MD)

## 2014-06-17 NOTE — H&P (Signed)
PATIENT NAME:  Amber Vasquez, Amber Vasquez MR#:  [id number] DATE OF BIRTH:  1940-07-08  DATE OF ADMISSION:  03/01/2011  REFERRING PHYSICIAN: ER physician, Dr. Enedina FinnerGoli  PRIMARY CARE PHYSICIAN: Dr. Diona FantiLarry Harper   CHIEF COMPLAINT: Cough, congestion, shortness of breath, pleuritic-type chest pain on and off for the past one month.   HISTORY OF PRESENT ILLNESS: The patient is a 74 year old female with past medical history of hypertension. The patient reports that she ran out of her blood pressure medication about two weeks ago and has not taken any. She has been having a cough for the past one month and recently started bringing up yellow-colored phlegm. She has been feeling hot, although she did not take her temperature. She has been having some shortness of breath and pleuritic type chest pain because of severe coughing.  She denies any pressure-like chest pain. She reports that she is normally very active. She works full-time and does all her activities of daily living including yard work.  She smokes cigarettes and has smoked for the past 30 years. She drinks gin, about 3 to 4 drinks on Friday and Saturday. Denies ever having any withdrawal symptoms. She has been under a lot of stress recently. She denies any hematemesis, melena, hematochezia, or bleeding per rectum. She does use BC powder occasionally for arthritis pain. She is describing pleuritic-type chest pain and back pain. She also has some abdominal soreness because of recurrent coughing.   ALLERGIES: No known drug allergies.   PAST MEDICAL HISTORY: Hypertension.   PAST SURGICAL HISTORY: Hysterectomy.   MEDICATIONS: The patient has been taking antihypertensive medication, does not remember the name.   FAMILY HISTORY: Mother had hypertension and heart disease. Father is 73 years old and still healthy.   SOCIAL HISTORY: The patient smokes cigarettes, 15 cigarettes per day, and has smoked for more than 30 years. She drinks 3 to 4 drinks of gin on  Fridays and  Saturdays. She is a widow and lives alone.   REVIEW OF SYSTEMS: CONSTITUTIONAL:  Reports feeling hot, weak, and fatigued. Reports recent weight loss, which she attributes to being under a lot of stress. EYES:  Wears glasses. Denies any vision changes or glaucoma. ENT: Denies any tinnitus or ear pain.  RESPIRATORY: Reports painful respiration, cough, and dyspnea. CARDIOVASCULAR: Reports pleuritic-type chest pain, denies any tachycardia or palpitations. GI: Denies any nausea, vomiting, or diarrhea. GU: Denies any nocturia, pyuria, or hematuria. MUSCULOSKELETAL: Reports right shoulder pain due to arthritis. Denies any gout or muscle pain. INTEGUMENT: Denies any rashes or eruptions. NEUROLOGICAL: Denies any fainting spells, seizures, or paralysis.  PSYCH: Denies any mood disorder or insomnia. Has been under a lot of stress. ENDOCRINE:  Denies any thyroid problems, heat or cold intolerance. HEME/LYMPH: Denies any anemia or easy bruisability.    PHYSICAL EXAMINATION:  VITAL SIGNS: Temperature 99.3, heart rate 88, respiratory rate 20, blood pressure 133/80, pulse oximetry 100% on room air.   GENERAL: The patient is an [place] female sitting comfortably in bed. She is intermittently coughing and sounds very congested.   HEAD: Atraumatic, normocephalic.   EYES: There is pallor. No icterus or cyanosis. Pupils equal, round, reactive to light and accommodation. Extraocular movements intact.    ENT: Dry mucous membranes. No oropharyngeal erythema or thrush.   NECK: Supple. No masses. No JVD. No thyromegaly. No lymphadenopathy.   CHEST WALL: No tenderness to palpation. Not using accessory muscles of respiration. No intercostal retractions.   LUNGS:  Bilaterally clear. No wheezing, rales, or rhonchi.  CARDIOVASCULAR: S1, S2 regular. No murmur, rubs, or gallops.   ABDOMEN: Soft, nontender, nondistended. No guarding or rigidity. Normal bowel sounds. The patient had very minimal epigastric  tenderness.  SKIN: No rashes or lesions.   PERIPHERIES: No pedal edema. 2+ pedal pulses.   MUSCULOSKELETAL: No cyanosis or clubbing.   NEUROLOGICAL: Awake, alert, oriented times three. Nonfocal neurological exam.   PSYCH: Normal mood and affect.   LABORATORY, DIAGNOSTIC, AND RADIOLOGICAL DATA: Chest x-ray shows right-sided hilar infiltrate. White count 10.9, hemoglobin 8.6, hematocrit 26.6, normal platelet count. Glucose is 85. Complete metabolic panel essentially normal. Troponin 0.21.   ASSESSMENT AND PLAN: 74 year old female with history of hypertension who presents with cough and congestion for one month. 1. Right-sided pneumonia:  We will obtain blood cultures, start empiric antibiotic, cough syrup, and nebulizers. Check for influenza A and B. 2. Anemia, normocytic: The patient reports occasional BC powder use for arthritis and also drinks gin on weekends. She denies any history of GI bleeding. We will check iron studies, stool guaiac, B12 and folate levels, and place on PPI.  3. Elevated troponin:  The patient reports pleuritic-type chest pain. She denies any pressure-like chest pain. Denies any history of coronary artery disease. She reports that she is very active and works full-time, does all her activities of daily living including her yard work.  It is possible that the patient has elevated troponins because of demand ischemia. We will check serial cardiac enzymes. We will not give her aspirin in view of her anemia. We will start on low-dose beta blocker. 4. Smoking, alcohol abuse: The patient was counseled about cessation for more than three minutes. She reports that she has not had a drink in five days. She denied any history of going into alcohol withdrawal, but was provided with a nicotine patch while in the hospital.  5. Hypertension: The patient does not remember the name of her medication. We will start on low-dose beta blocker.  Discussed with the ER physician and discussed  with the patient the plan of care and management.   TIME SPENT: 75 minutes.   ____________________________ Darrick Meigs, MD sp:bjt D: 03/01/2011 15:10:34 ET T: 03/01/2011 15:42:44 ET JOB#: [id number]  cc: Darrick Meigs, MD, <Dictator> Carla Drape, MD Darrick Meigs MD ELECTRONICALLY SIGNED 03/02/2011 12:14

## 2014-06-17 NOTE — Consult Note (Signed)
PATIENT NAME:  Amber Vasquez, Amber Vasquez MR#:  [id number] DATE OF BIRTH:  [birth year]  DATE OF CONSULTATION:  03/03/2011  REFERRING PHYSICIAN:   CONSULTING PHYSICIAN:  Keturah Barrehristiane H. Viktoria Gruetzmacher, NP  HISTORY OF PRESENT ILLNESS: Ms. Amber Vasquez was admitted on 01/06 of this month with some respiratory issues. Please see full history of present illness for that. Gastroenterology has been consulted at the request of Dr. Burnadette PopLinthavong for evaluation of her hemoptysis, anemia, and mild abdominal pain. For her hemoptysis, the patient reports that she saw some bright red streaky material in her phlegm that she coughed up a couple of days ago and has not seen any further since. Regarding her anemia and abdominal pain, she does relate a history of epigastric discomfort, nausea, and early satiety since last Thanksgiving. She reports a weight loss of 20 pounds since then and two episodes of black, tarry stools last Thursday and this last Saturday. She states that she has [facility] every morning before breakfast for the last two years and has not been on any kind of acid reducing medication at home. She has no history of colonoscopy or EGD. She additionally reports some mild nausea and burping, but denies difficult or painful swallowing, indigestion, bloating, or excessive gas. Her hemoglobin today was 7.7. Her history is also significant for smoking cigarettes for 30 years and three to four gin drinks on the weekend.   ALLERGIES: No known drug allergies.   PAST MEDICAL HISTORY: Hypertension.   PAST SURGICAL HISTORY: Hysterectomy.   MEDICATIONS: She takes an antihypertensive medication, she does not remember the name. She is on pantoprazole here at the hospital.   FAMILY HISTORY: History is significant for hypertension and heart disease. Her father is [id number] and is still healthy, per the history and physical. She does not know of any colorectal cancer, liver disease, or peptic ulcer disease in her family.   SOCIAL HISTORY: She smokes  15 cigarettes a day and drinks three to four drinks of gin on the weekends. She is widowed and lives alone. She does report that she has had recent loss of her mother and siblings and has been trying to care for her family and has not really followed up with her health.   REVIEW OF SYSTEMS: CONSTITUTIONAL: Intermittent hot flashes at times, some fatigue and weakness, and weight loss as noted. No fevers. EYES: Wears glasses. No vision changes or glaucoma. ENT: No changes of hearing, pain, sinus drainage, difficulty swallowing, or mouth sores. RESPIRATORY: Has been hospitalized with presumed pneumonia. Has had some cough and dyspnea, is feeling better with this. CARDIOVASCULAR: She does appear to have some changes on her EKG from the autumn. Her troponins have been mildly elevated. This is being presumed to cardiac strain at present. She has had some chest pain of pleuritic type, but does not have any now. She denies tachycardia or palpitations. GI: As noted. GU: No dysuria or hematuria. MUSCULOSKELETAL: History of right shoulder pain over the last couple of years that she was taking the [facility] for. No gout. INTEGUMENTARY: No easy bruising, bleeding, rash, erythema, or lesions. NEUROLOGICAL: No history of stroke, syncope, seizures, or paralysis. PSYCH: Denies mood disorders, insomnia. Does report some stress. ENDOCRINE: No history of thyroid problems, heat or cold intolerance. HEMATOLOGIC: No known history of diabetes or anemia.   LABS/STUDIES: Most recent lab work: Serum glucose 83, serum iron 24, BUN 8, creatinine 1.06, serum sodium 143, serum potassium 3.7, and serum chloride 106. GFR greater than 60. Total iron binding  capacity 157, unbound iron binding capacity 133, and iron saturation 15%. Calcium 6.5, total serum protein 03/01/2011 6.6, albumin 2.9, total bilirubin 0.5, direct bilirubin 0.2, alkaline phosphatase 74, AST 14, and ALT 10. WBC 7.4, hemoglobin 7.7, hematocrit 24.4, and platelet count 275,  normocytic.  Blood cultures from 03/01/2011 with no growth.  Vitamin B12 345, which is normal.   Echo on 03/02/2011 with possible mild left ventricular hypertrophy, normal left ventricular function with an ejection fraction of 64%, trace mitral and tricuspid regurgitation, and aortic valve did not have any significant stenosis or regurgitation.   Troponin with very slight elevations.   Normal sinus rhythm, per the EKG. Questionable anterior infarct to the EKG when compared with EKG from 01/12/2011.   Chest x-ray consistent with some chronic obstructive pulmonary disease, persistent slightly more prominent density in the right middle lobe consistent with chronic atelectasis, recurrent or chronic infiltrate.  PHYSICAL EXAMINATION:   VITALS: Most recent temperature 96.1, pulse 87, respiratory rate 20, blood pressure 149/73, and oxygen saturation 99% on room air.   GENERAL: Thin, African American female resting comfortably in bed.   HEENT: Atraumatic, normocephalic. Eyes symmetrical without redness, drainage, or inflammation. Nares without redness, drainage, or inflammation. Oral mucosa pink and moist.   NECK: Supple. No JVD, no thyromegaly or lymphadenopathy.   CHEST: Respirations eupneic. Lungs clear to auscultation bilaterally. No cough at present.   CARDIOVASCULAR: S1 and S2. RRR. No MRG. Has been in normal sinus rhythm. Peripheral pulses 2+. No edema.   ABDOMEN: Flat, nondistended, active bowel sounds x4, nontender. No guarding, hepatosplenomegaly, peritoneal signs, or hernias.   RECTAL: No obvious abnormalities. Stool brown, heme-negative. Nontender.    SKIN: No erythema, rashes, or lesions.   MUSCULOSKELETAL: No deformities. Strength 5/5. Gait steady. No clubbing, cyanosis, or edema.   NEUROLOGIC: Awake, alert and oriented x3. Cranial nerves II through XII intact. Speech clear. No facial droop.   PSYCHIATRIC: Pleasant, cooperative, mood stable, logical thought.   ASSESSMENT  AND RECOMMENDATIONS: Anemia of likely blood loss from NSAID-related gastrointestinal bleeding, however, the patient is heme-negative at present. Would like to the EGD; however, we will need cardiac clearance first due to recent EKG changes and slight troponin elevation. Dr. Marva Panda did discuss this with Dr. Marisue Ivan who is to contact cardiology. Agree with PPI therapy. We will increase pantoprazole to twice a day and would transfuse p.r.n.   These services were provided by Vevelyn Pat, MSN, NPC in collaboration with Christena Deem, MD.   ____________________________ Keturah Barre, NP chl:slb D: 03/04/2011 13:22:45 ET T: 03/04/2011 13:33:38 ET JOB#: [id number]  cc: Keturah Barre, NP, <Dictator> Eustaquio Maize Melbert Botelho FNP ELECTRONICALLY SIGNED 03/05/2011 12:40

## 2015-03-23 ENCOUNTER — Encounter: Payer: Self-pay | Admitting: Emergency Medicine

## 2015-03-23 ENCOUNTER — Emergency Department
Admission: EM | Admit: 2015-03-23 | Discharge: 2015-03-23 | Disposition: A | Discharge status: Home / Self Care | Payer: Medicare PPO | Attending: Emergency Medicine | Admitting: Emergency Medicine

## 2015-03-23 ENCOUNTER — Emergency Department: Payer: Medicare PPO

## 2015-03-23 DIAGNOSIS — Y998 Other external cause status: Secondary | ICD-10-CM | POA: Diagnosis not present

## 2015-03-23 DIAGNOSIS — Y9289 Other specified places as the place of occurrence of the external cause: Secondary | ICD-10-CM | POA: Diagnosis not present

## 2015-03-23 DIAGNOSIS — S0990XA Unspecified injury of head, initial encounter: Secondary | ICD-10-CM | POA: Diagnosis present

## 2015-03-23 DIAGNOSIS — S0003XA Contusion of scalp, initial encounter: Secondary | ICD-10-CM | POA: Insufficient documentation

## 2015-03-23 DIAGNOSIS — I10 Essential (primary) hypertension: Secondary | ICD-10-CM | POA: Insufficient documentation

## 2015-03-23 DIAGNOSIS — Y9389 Activity, other specified: Secondary | ICD-10-CM | POA: Diagnosis not present

## 2015-03-23 DIAGNOSIS — W01198A Fall on same level from slipping, tripping and stumbling with subsequent striking against other object, initial encounter: Secondary | ICD-10-CM | POA: Insufficient documentation

## 2015-03-23 DIAGNOSIS — F17210 Nicotine dependence, cigarettes, uncomplicated: Secondary | ICD-10-CM | POA: Insufficient documentation

## 2015-03-23 HISTORY — DX: Essential (primary) hypertension: I10

## 2015-03-23 MED ORDER — ACETAMINOPHEN 500 MG PO TABS
500.0000 mg | ORAL_TABLET | Freq: Once | ORAL | Status: AC
  Administered 2015-03-23: 500 mg via ORAL
  Filled 2015-03-23: qty 1

## 2015-03-23 MED ORDER — HYDROCHLOROTHIAZIDE 25 MG PO TABS: 25.0000 mg | ORAL_TABLET | Freq: Every day | ORAL | Status: DC

## 2015-03-23 NOTE — ED Notes (Signed)
States fell one week ago and hit head on coffee table. States was mechanical fall, has difficulty with leg strength and lost balance. Denies LOC. States has scalp tenderness and hematoma is noted to scalp. Denies taking blood thinners.

## 2015-03-23 NOTE — ED Notes (Signed)
Pt verbalized understanding of discharge instructions. NAD at this time. 

## 2015-03-23 NOTE — ED Notes (Signed)
Pt states she hasn't taken BP medication in over a year. States she chose to stop taking b/c it made her feel like she was going to "pass out". BP 192/95. Pt states she has loses her balance when she bends over.

## 2015-03-23 NOTE — Discharge Instructions (Signed)
You were evaluated for scalp pain and headache and found to have bruising to the scalp. Your CT scan of the head was reassuring for no serious injury. Stop taking BC as this has aspirin which is a blood thinner. For pain and discomfort you may take Tylenol or ibuprofen over-the-counter.  Your blood pressure was also found to be elevated, and I am starting you on hydrochlorothiazide for blood pressure control. You need to see primary care doctor in 1 week for repeat of your blood pressure.  Return to the emergency department for any worsening pain, headache, confusion, weakness or numbness.   Contusion A contusion is a deep bruise. Contusions happen when an injury causes bleeding under the skin. Symptoms of bruising include pain, swelling, and discolored skin. The skin may turn blue, purple, or yellow. HOME CARE   Rest the injured area.  If told, put ice on the injured area.  Put ice in a plastic bag.  Place a towel between your skin and the bag.  Leave the ice on for 20 minutes, 2-3 times per day.  If told, put light pressure (compression) on the injured area using an elastic bandage. Make sure the bandage is not too tight. Remove it and put it back on as told by your doctor.  If possible, raise (elevate) the injured area above the level of your heart while you are sitting or lying down.  Take over-the-counter and prescription medicines only as told by your doctor. GET HELP IF:  Your symptoms do not get better after several days of treatment.  Your symptoms get worse.  You have trouble moving the injured area. GET HELP RIGHT AWAY IF:   You have very bad pain.  You have a loss of feeling (numbness) in a hand or foot.  Your hand or foot turns pale or cold.   This information is not intended to replace advice given to you by your health care provider. Make sure you discuss any questions you have with your health care provider.   Document Released: 07/29/2007 Document Revised:  10/31/2014 Document Reviewed: 06/27/2014 Elsevier Interactive Patient Education [employer]

## 2015-03-23 NOTE — ED Provider Notes (Signed)
[facility] Emergency Department Provider Note   ____________________________________________  Time seen: Approximately 8:45 AM I have reviewed the triage vital signs and the triage nursing note.  HISTORY  Chief Complaint Head Injury   Historian Patient  HPI Amber Vasquez is a 75 y.o. female states shefell and struck her head against an iron coffee table last Saturday, one week ago and has had persistent pain and swelling at her scalp. She takes BCs often for body pains, and has been taking increased amount of BCs since she struck her head. Otherwise she does not take any medications. She has a history of hypertension for which she does not take medication for. She does not have a primary care physician right now. She doesn't take other blood thinners.  She has some dizziness. No vision changes. Headache is mild to moderate in his right sided and especially tender over the scalp where there is a hematoma. She has pain behind the right mastoid with a bruise there as well.    Past Medical History  Diagnosis Date  . Hypertension     There are no active problems to display for this patient.   Past Surgical History  Procedure Laterality Date  . Abdominal hysterectomy      Current Outpatient Rx  Name  Route  Sig  Dispense  Refill  . hydrochlorothiazide (HYDRODIURIL) 25 MG tablet   Oral   Take 1 tablet (25 mg total) by mouth daily.   30 tablet   0     Allergies Review of patient's allergies indicates no known allergies.  No family history on file.  Social History Social History  Substance Use Topics  . Smoking status: Current Some Day Smoker -- 0.50 packs/day    Types: Cigarettes  . Smokeless tobacco: None  . Alcohol Use: Yes    Review of Systems  Constitutional: Negative for fever. Eyes: Negative for visual changes. ENT: Negative for sore throat. Cardiovascular: Negative for chest pain. Respiratory: Negative for shortness of  breath. Gastrointestinal: Negative for abdominal pain, vomiting and diarrhea. Genitourinary: Negative for dysuria. Musculoskeletal: Negative for back pain. Skin: Negative for rash. Neurological: Significant for headache. 10 point Review of Systems otherwise negative ____________________________________________   PHYSICAL EXAM:  VITAL SIGNS: ED Triage Vitals  Enc Vitals Group     BP 03/23/15 0825 217/89 mmHg     Pulse Rate 03/23/15 0825 66     Resp 03/23/15 0825 8     Temp 03/23/15 0825 97.7 F (36.5 C)     Temp Source 03/23/15 0825 Oral     SpO2 03/23/15 0825 100 %     Weight 03/23/15 0825 125 lb (56.7 kg)     Height 03/23/15 0825  (1.753 m)     Head Cir --      Peak Flow --      Pain Score 03/23/15 0826 5     Pain Loc --      Pain Edu? --      Excl. in GC? --      Constitutional: Alert and oriented. Well appearing and in no distress. Eyes: Conjunctivae are normal. PERRL. Normal extraocular movements. ENT   Head: Somewhat boggy hematoma to the right posterior scalp, with visible ecchymosis behind the ear over the mastoid area..   Nose: No congestion/rhinnorhea.   Mouth/Throat: Mucous membranes are moist.   Neck: No stridor. No midline C-spine tenderness to palpation or range of motion Cardiovascular/Chest: Normal rate, regular rhythm.  No murmurs, rubs,  or gallops. Respiratory: Normal respiratory effort without tachypnea nor retractions. Breath sounds are clear and equal bilaterally. No wheezes/rales/rhonchi. Gastrointestinal: Soft. No distention, no guarding, no rebound. Nontender.    Genitourinary/rectal:Deferred Musculoskeletal: Nontender with normal range of motion in all extremities. No joint effusions.  No lower extremity tenderness.  No edema. Neurologic:  Normal speech and language. No gross or focal neurologic deficits are appreciated. Skin:  Skin is warm, dry and intact. No rash noted. Psychiatric: Mood and affect are normal. Speech and  behavior are normal. Patient exhibits appropriate insight and judgment.  ____________________________________________   EKG I, Governor Rooks, MD, the attending physician have personally viewed and interpreted all ECGs.  None ____________________________________________  LABS (pertinent positives/negatives)  None  ____________________________________________  RADIOLOGY All Xrays were viewed by me. Imaging interpreted by Radiologist.  CT head noncontrast:  IMPRESSION: No evidence of acute intracranial abnormality.  Right scalp hematoma without fracture.  Atrophy and mild chronic small-vessel white matter ischemic changes. __________________________________________  PROCEDURES  Procedure(s) performed: None  Critical Care performed: None  ____________________________________________   ED COURSE / ASSESSMENT AND PLAN  Pertinent labs & imaging results that were available during my care of the patient were reviewed by me and considered in my medical decision making (see chart for details).  It sounds like the initial injury 1 week ago was a mechanical fall, not a medical syncope.  She does have persistent fairly large hematoma/ecchymosis, which is likely contributed to by the fact that she is taking BC/aspirin. By history and clinical exam, no evidence of other traumatic injury, aside from the right scalp into the right mastoid area.  CT head negative for intracranial injury. I'm asking her to stop the PVCs.  Asymptomatic from chronic hypertension. I will place her back on medication for high blood pressure, hydrocodone and asked her to follow-up for recheck of her blood pressure next week.    CONSULTATIONS:   none   Patient / Family / Caregiver informed of clinical course, medical decision-making process, and agree with plan.   I discussed return precautions, follow-up instructions, and discharged instructions with patient and/or family.  Discharge  instructions:  You were evaluated for scalp pain and headache and found to have bruising to the scalp. Your CT scan of the head was reassuring for no serious injury. Stop taking BC as this has aspirin which is a blood thinner. For pain and discomfort you may take Tylenol or ibuprofen over-the-counter.  Your blood pressure was also found to be elevated, and I am starting you on hydrochlorothiazide for blood pressure control. You need to see primary care doctor in 1 week for repeat of your blood pressure.  Return to the emergency department for any worsening pain, headache, confusion, weakness or numbness. ___________________________________________   FINAL CLINICAL IMPRESSION(S) / ED DIAGNOSES   Final diagnoses:  Scalp hematoma, initial encounter  Essential hypertension              Note: This dictation was prepared with Dragon dictation. Any transcriptional errors that result from this process are unintentional   Governor Rooks, MD 03/23/15 1052

## 2016-03-24 ENCOUNTER — Encounter: Payer: Self-pay | Admitting: Emergency Medicine

## 2016-03-24 ENCOUNTER — Emergency Department
Admission: EM | Admit: 2016-03-24 | Discharge: 2016-03-24 | Disposition: A | Discharge status: Home / Self Care | Payer: Medicare PPO | Attending: Emergency Medicine | Admitting: Emergency Medicine

## 2016-03-24 DIAGNOSIS — J069 Acute upper respiratory infection, unspecified: Secondary | ICD-10-CM | POA: Diagnosis not present

## 2016-03-24 DIAGNOSIS — R05 Cough: Secondary | ICD-10-CM | POA: Diagnosis present

## 2016-03-24 DIAGNOSIS — B9789 Other viral agents as the cause of diseases classified elsewhere: Secondary | ICD-10-CM

## 2016-03-24 DIAGNOSIS — I10 Essential (primary) hypertension: Secondary | ICD-10-CM | POA: Insufficient documentation

## 2016-03-24 DIAGNOSIS — F17210 Nicotine dependence, cigarettes, uncomplicated: Secondary | ICD-10-CM | POA: Diagnosis not present

## 2016-03-24 LAB — INFLUENZA PANEL BY PCR (TYPE A & B)
INFLAPCR: NEGATIVE
Influenza B By PCR: NEGATIVE

## 2016-03-24 MED ORDER — BENZONATATE 100 MG PO CAPS: 200.0000 mg | ORAL_CAPSULE | Freq: Three times a day (TID) | ORAL | 0 refills | Status: AC | PRN

## 2016-03-24 NOTE — ED Provider Notes (Signed)
[facility] Emergency Department Provider Note  ____________________________________________   First MD Initiated Contact with Patient 03/24/16 1019     (approximate)  I have reviewed the triage vital signs and the nursing notes.   HISTORY  Chief Complaint Influenza    HPI Amber Vasquez is a 76 y.o. female is here complaining of head congestion, body aches, nonproductive cough and sore throat since Friday. Patient states she's been taking over-the-counter medication without any improvement. She denies any fever, chills, nausea or vomiting. She continues to smoke one half pack cigarettes per day. Rarely she rates her pain as an 8 out of 10.   Past Medical History:  Diagnosis Date  . Hypertension     There are no active problems to display for this patient.   Past Surgical History:  Procedure Laterality Date  . ABDOMINAL HYSTERECTOMY      Prior to Admission medications   Medication Sig Start Date End Date Taking? Authorizing Provider  benzonatate (TESSALON PERLES) 100 MG capsule Take 2 capsules (200 mg total) by mouth 3 (three) times daily as needed. 03/24/16 03/24/17  Tommi Rumps, PA-C  hydrochlorothiazide (HYDRODIURIL) 25 MG tablet Take 1 tablet (25 mg total) by mouth daily. 03/23/15   Governor Rooks, MD    Allergies Patient has no known allergies.  History reviewed. No pertinent family history.  Social History Social History  Substance Use Topics  . Smoking status: Current Some Day Smoker    Packs/day: 0.50    Types: Cigarettes  . Smokeless tobacco: Never Used  . Alcohol use Yes    Review of Systems Constitutional: Negative fever/chills Eyes: No visual changes. ENT: Positive sore throat. Positive nasal congestion. Cardiovascular: Denies chest pain. Respiratory: Denies shortness of breath. Positive nonproductive cough. Gastrointestinal: No abdominal pain.  No nausea, no vomiting.  No diarrhea.   Genitourinary: Negative for  dysuria. Musculoskeletal: Positive generalized body aches Skin: Negative for rash. Neurological: Positive for headaches, no focal weakness or numbness.  10-point ROS otherwise negative.  ____________________________________________   PHYSICAL EXAM:  VITAL SIGNS: ED Triage Vitals  Enc Vitals Group     BP 03/24/16 0949 (!) 168/78     Pulse Rate 03/24/16 0949 86     Resp 03/24/16 0949 18     Temp 03/24/16 0949 98.2 F (36.8 C)     Temp Source 03/24/16 0949 Oral     SpO2 03/24/16 0949 100 %     Weight 03/24/16 0949 130 lb (59 kg)     Height 03/24/16 0949 5\' 9"  (1.753 m)     Head Circumference --      Peak Flow --      Pain Score 03/24/16 1004 8     Pain Loc --      Pain Edu? --      Excl. in GC? --     Constitutional: Alert and oriented. Well appearing and in no acute distress. Eyes: Conjunctivae are normal. PERRL. EOMI. Head: Atraumatic. Nose:Mild congestion/rhinnorhea. EACs and TMs are clear bilaterally. Mouth/Throat: Mucous membranes are moist.  Oropharynx non-erythematous.The posterior drainage. Neck: No stridor.   Hematological/Lymphatic/Immunilogical: No cervical lymphadenopathy. Cardiovascular: Normal rate, regular rhythm. Grossly normal heart sounds.  Good peripheral circulation. Respiratory: Normal respiratory effort.  No retractions. Lungs CTAB. Gastrointestinal: Soft and nontender. No distention. Musculoskeletal: Moves upper and lower extremities without difficulty. Normal gait was noted and without assistance. Neurologic:  Normal speech and language. No gross focal neurologic deficits are appreciated. No gait instability. Skin:  Skin  is warm, dry and intact. No rash noted. Psychiatric: Mood and affect are normal. Speech and behavior are normal.  ____________________________________________   LABS (all labs ordered are listed, but only abnormal results are displayed)  Labs Reviewed  INFLUENZA PANEL BY PCR (TYPE A & B)     PROCEDURES  Procedure(s)  performed: None  Procedures  Critical Care performed: No  ____________________________________________   INITIAL IMPRESSION / ASSESSMENT AND PLAN / ED COURSE  Pertinent labs & imaging results that were available during my care of the patient were reviewed by me and considered in my medical decision making (see chart for details).  She was made aware that her influenza test was negative. Patient was given a prescription for Tessalon Perles 1 or 2 every 8 hours as needed for cough. She is to increase her fluids. She'll take Tylenol as needed for fever, body aches and headache. She is encouraged to decrease her smoking. She also was reminded that [facility] continued problems as a visit there is completely free and part of her benefits.    ___________________________________________   FINAL CLINICAL IMPRESSION(S) / ED DIAGNOSES  Final diagnoses:  Viral URI with cough  Cigarette smoker      NEW MEDICATIONS STARTED DURING THIS VISIT:  Discharge Medication List as of 03/24/2016 11:43 AM    START taking these medications   Details  benzonatate (TESSALON PERLES) 100 MG capsule Take 2 capsules (200 mg total) by mouth 3 (three) times daily as needed., Starting Tue 03/24/2016, Until Wed 03/24/2017, Print         Note:  This document was prepared using Dragon voice recognition software and may include unintentional dictation errors.    Tommi Rumpshonda L Yue Flanigan, PA-C 03/24/16 1811    Jene Everyobert Kinner, MD 03/30/16 [phone]

## 2016-03-24 NOTE — ED Triage Notes (Signed)
Pt to triage with head congestion, body aches, non-productive cough since Friday. Did have sore throat Saturday. Been taking OTC meds. Denies fever.

## 2016-03-24 NOTE — Discharge Instructions (Signed)
Follow-up with the employee clinic [facility] if any continued problems. Tessalon Perles one or 2 every 8 hours as needed for cough. The crease fluids. Tylenol as needed for fever, body aches, headache. Decrease smoking.

## 2016-06-07 ENCOUNTER — Emergency Department
Admission: EM | Admit: 2016-06-07 | Discharge: 2016-06-07 | Disposition: A | Discharge status: Home / Self Care | Payer: Medicare PPO | Attending: Student in an Organized Health Care Education/Training Program | Admitting: Student in an Organized Health Care Education/Training Program

## 2016-06-07 ENCOUNTER — Emergency Department: Payer: Medicare PPO

## 2016-06-07 DIAGNOSIS — F17210 Nicotine dependence, cigarettes, uncomplicated: Secondary | ICD-10-CM | POA: Insufficient documentation

## 2016-06-07 DIAGNOSIS — I10 Essential (primary) hypertension: Secondary | ICD-10-CM | POA: Diagnosis not present

## 2016-06-07 DIAGNOSIS — R05 Cough: Secondary | ICD-10-CM | POA: Diagnosis present

## 2016-06-07 DIAGNOSIS — J209 Acute bronchitis, unspecified: Secondary | ICD-10-CM | POA: Diagnosis not present

## 2016-06-07 LAB — BASIC METABOLIC PANEL
Anion gap: 7 (ref 5–15)
Anion gap: 7 (ref 5–15)
BUN: 23 mg/dL — AB (ref 6–20)
BUN: 23 mg/dL — AB (ref 6–20)
CALCIUM: 8.6 mg/dL — AB (ref 8.9–10.3)
CHLORIDE: 106 mmol/L (ref 101–111)
CHLORIDE: 108 mmol/L (ref 101–111)
CO2: 26 mmol/L (ref 22–32)
CO2: 24 mmol/L (ref 22–32)
CREATININE: 1.46 mg/dL — AB (ref 0.44–1.00)
CREATININE: 1.21 mg/dL — AB (ref 0.44–1.00)
Calcium: 8.4 mg/dL — ABNORMAL LOW (ref 8.9–10.3)
GFR calc Af Amer: 49 mL/min — ABNORMAL LOW (ref >60)
GFR calc non Af Amer: 34 mL/min — ABNORMAL LOW (ref >60)
GFR calc non Af Amer: 43 mL/min — ABNORMAL LOW (ref >60)
GFR, EST AFRICAN AMERICAN: 39 mL/min — AB (ref >60)
GLUCOSE: 89 mg/dL (ref 65–99)
GLUCOSE: 165 mg/dL — AB (ref 65–99)
POTASSIUM: 3.4 mmol/L — AB (ref 3.5–5.1)
Potassium: 5.5 mmol/L — ABNORMAL HIGH (ref 3.5–5.1)
SODIUM: 139 mmol/L (ref 135–145)
Sodium: 139 mmol/L (ref 135–145)

## 2016-06-07 LAB — CBC WITH DIFFERENTIAL/PLATELET
BASOS PCT: 1 %
Basophils Absolute: 0.1 10*3/uL (ref 0–0.1)
Eosinophils Absolute: 0.1 10*3/uL (ref 0–0.7)
Eosinophils Relative: 2 %
HEMATOCRIT: 35.3 % (ref 35.0–47.0)
HEMOGLOBIN: 11.4 g/dL — AB (ref 12.0–16.0)
LYMPHS ABS: 2.1 10*3/uL (ref 1.0–3.6)
Lymphocytes Relative: 28 %
MCH: 25.5 pg — AB (ref 26.0–34.0)
MCHC: 32.3 g/dL (ref 32.0–36.0)
MCV: 79 fL — ABNORMAL LOW (ref 80.0–100.0)
MONO ABS: 0.8 10*3/uL (ref 0.2–0.9)
MONOS PCT: 11 %
Neutro Abs: 4.2 10*3/uL (ref 1.4–6.5)
Neutrophils Relative %: 58 %
Platelets: 173 10*3/uL (ref 150–440)
RBC: 4.47 MIL/uL (ref 3.80–5.20)
RDW: 15.9 % — AB (ref 11.5–14.5)
WBC: 7.3 10*3/uL (ref 3.6–11.0)

## 2016-06-07 LAB — TROPONIN I: Troponin I: <0.03 ng/mL (ref <0.03)

## 2016-06-07 MED ORDER — PREDNISONE 20 MG PO TABS
40.0000 mg | ORAL_TABLET | Freq: Once | ORAL | Status: AC
Start: 2016-06-07 — End: 2016-06-07
  Administered 2016-06-07: 40 mg via ORAL
  Filled 2016-06-07: qty 2

## 2016-06-07 MED ORDER — DOXYCYCLINE HYCLATE 50 MG PO CAPS: 100.0000 mg | ORAL_CAPSULE | Freq: Two times a day (BID) | ORAL | 0 refills | Status: AC

## 2016-06-07 MED ORDER — ALBUTEROL SULFATE HFA 108 (90 BASE) MCG/ACT IN AERS: 2.0000 | INHALATION_SPRAY | Freq: Four times a day (QID) | RESPIRATORY_TRACT | 2 refills | Status: DC | PRN

## 2016-06-07 MED ORDER — IPRATROPIUM-ALBUTEROL 0.5-2.5 (3) MG/3ML IN SOLN
3.0000 mL | Freq: Once | RESPIRATORY_TRACT | Status: AC
  Administered 2016-06-07: 3 mL via RESPIRATORY_TRACT
  Filled 2016-06-07: qty 3

## 2016-06-07 MED ORDER — SODIUM CHLORIDE 0.9 % IV BOLUS (SEPSIS)
1000.0000 mL | Freq: Once | INTRAVENOUS | Status: AC
  Administered 2016-06-07: 1000 mL via INTRAVENOUS

## 2016-06-07 MED ORDER — DOXYCYCLINE HYCLATE 100 MG PO TABS
100.0000 mg | ORAL_TABLET | Freq: Once | ORAL | Status: AC
  Administered 2016-06-07: 100 mg via ORAL
  Filled 2016-06-07: qty 1

## 2016-06-07 MED ORDER — PREDNISONE 20 MG PO TABS: 20.0000 mg | ORAL_TABLET | Freq: Every day | ORAL | 0 refills | Status: AC

## 2016-06-07 NOTE — ED Triage Notes (Signed)
Pt to ED from home c/o cough. Pt reports productive cough for the past week with SOB as well as chest pain and back pain from "coughing so hard". Pt alert and oriented in no acute distress at this time.

## 2016-06-07 NOTE — ED Provider Notes (Signed)
[facility] Emergency Department Provider Note    First MD Initiated Contact with Patient 06/07/16 1555     (approximate)  I have reviewed the triage vital signs and the nursing notes.   HISTORY  Chief Complaint Cough    HPI Amber Vasquez is a 76 y.o. female with a history of extensive smoke cigarette use presents for 4 days of productive cough that started on Thursday. No measured fevers but has felt chills. Does feel short of breath. States that she's having worsening right shoulder pain related to the cough. No lower extremity swelling. No chest pain or pressure. States the discomfort is not changed with ambulation or going down the hall. No diaphoresis. No nausea or vomiting.   Past Medical History:  Diagnosis Date  . Hypertension    Family History  Problem Relation Age of Onset  . Hypertension Mother    Past Surgical History:  Procedure Laterality Date  . ABDOMINAL HYSTERECTOMY     There are no active problems to display for this patient.     Prior to Admission medications   Medication Sig Start Date End Date Taking? Authorizing Provider  albuterol (PROVENTIL HFA;VENTOLIN HFA) 108 (90 Base) MCG/ACT inhaler Inhale 2 puffs into the lungs every 6 (six) hours as needed for wheezing or shortness of breath. 06/07/16   Willy Eddy, MD  benzonatate (TESSALON PERLES) 100 MG capsule Take 2 capsules (200 mg total) by mouth 3 (three) times daily as needed. 03/24/16 03/24/17  Tommi Rumps, PA-C  doxycycline (VIBRAMYCIN) 50 MG capsule Take 2 capsules (100 mg total) by mouth 2 (two) times daily. 06/07/16 06/14/16  Willy Eddy, MD  hydrochlorothiazide (HYDRODIURIL) 25 MG tablet Take 1 tablet (25 mg total) by mouth daily. 03/23/15   Governor Rooks, MD  predniSONE (DELTASONE) 20 MG tablet Take 1 tablet (20 mg total) by mouth daily. 06/07/16 06/11/16  Willy Eddy, MD    Allergies Patient has no known allergies.    Social History Social History    Substance Use Topics  . Smoking status: Current Some Day Smoker    Packs/day: 0.50    Types: Cigarettes  . Smokeless tobacco: Never Used  . Alcohol use Yes    Review of Systems Patient denies headaches, rhinorrhea, blurry vision, numbness, shortness of breath, chest pain, edema, cough, abdominal pain, nausea, vomiting, diarrhea, dysuria, fevers, rashes or hallucinations unless otherwise stated above in HPI. ____________________________________________   PHYSICAL EXAM:  VITAL SIGNS: Vitals:   06/07/16 1915 06/07/16 1930  BP: (!) 172/79 (!) 165/78  Pulse: (!) 101 95  Resp:  (!) 21  Temp:      Constitutional: Alert and oriented. Well appearing and in no acute distress. Eyes: Conjunctivae are normal. PERRL. EOMI. Head: Atraumatic. Nose: No congestion/rhinnorhea. Mouth/Throat: Mucous membranes are moist.  Oropharynx non-erythematous. Neck: No stridor. Painless ROM. No cervical spine tenderness to palpation Hematological/Lymphatic/Immunilogical: No cervical lymphadenopathy. Cardiovascular: Normal rate, regular rhythm. Grossly normal heart sounds.  Good peripheral circulation. Respiratory: Normal respiratory effort.  No retractions. Lungs with diffuse inspiratory and expiratory wheezes with rhonchi in RLL fields Gastrointestinal: Soft and nontender. No distention. No abdominal bruits. No CVA tenderness. Genitourinary:  Musculoskeletal: No lower extremity tenderness nor edema.  No joint effusions. Neurologic:  Normal speech and language. No gross focal neurologic deficits are appreciated. No gait instability. Skin:  Skin is warm, dry and intact. No rash noted. Psychiatric: Mood and affect are normal. Speech and behavior are normal.  ____________________________________________   LABS (all labs  ordered are listed, but only abnormal results are displayed)  Results for orders placed or performed during the hospital encounter of 06/07/16 (from the past 24 hour(s))  CBC with  Differential/Platelet     Status: Abnormal   Collection Time: 06/07/16  3:58 PM  Result Value Ref Range   WBC 7.3 3.6 - 11.0 K/uL   RBC 4.47 3.80 - 5.20 MIL/uL   Hemoglobin 11.4 (L) 12.0 - 16.0 g/dL   HCT 16.1 09.6 - 04.5 %   MCV 79.0 (L) 80.0 - 100.0 fL   MCH 25.5 (L) 26.0 - 34.0 pg   MCHC 32.3 32.0 - 36.0 g/dL   RDW 40.9 (H) 81.1 - 91.4 %   Platelets 173 150 - 440 K/uL   Neutrophils Relative % 58 %   Neutro Abs 4.2 1.4 - 6.5 K/uL   Lymphocytes Relative 28 %   Lymphs Abs 2.1 1.0 - 3.6 K/uL   Monocytes Relative 11 %   Monocytes Absolute 0.8 0.2 - 0.9 K/uL   Eosinophils Relative 2 %   Eosinophils Absolute 0.1 0 - 0.7 K/uL   Basophils Relative 1 %   Basophils Absolute 0.1 0 - 0.1 K/uL  Basic metabolic panel     Status: Abnormal   Collection Time: 06/07/16  3:58 PM  Result Value Ref Range   Sodium 139 135 - 145 mmol/L   Potassium 5.5 (H) 3.5 - 5.1 mmol/L   Chloride 106 101 - 111 mmol/L   CO2 26 22 - 32 mmol/L   Glucose, Bld 89 65 - 99 mg/dL   BUN 23 (H) 6 - 20 mg/dL   Creatinine, Ser 7.82 (H) 0.44 - 1.00 mg/dL   Calcium 8.6 (L) 8.9 - 10.3 mg/dL   GFR calc non Af Amer 34 (L) >60 mL/min   GFR calc Af Amer 39 (L) >60 mL/min   Anion gap 7 5 - 15  Troponin I     Status: None   Collection Time: 06/07/16  4:08 PM  Result Value Ref Range   Troponin I <0.03 <0.03 ng/mL  Basic metabolic panel     Status: Abnormal   Collection Time: 06/07/16  7:12 PM  Result Value Ref Range   Sodium 139 135 - 145 mmol/L   Potassium 3.4 (L) 3.5 - 5.1 mmol/L   Chloride 108 101 - 111 mmol/L   CO2 24 22 - 32 mmol/L   Glucose, Bld 165 (H) 65 - 99 mg/dL   BUN 23 (H) 6 - 20 mg/dL   Creatinine, Ser 9.56 (H) 0.44 - 1.00 mg/dL   Calcium 8.4 (L) 8.9 - 10.3 mg/dL   GFR calc non Af Amer 43 (L) >60 mL/min   GFR calc Af Amer 49 (L) >60 mL/min   Anion gap 7 5 - 15   ____________________________________________  EKG My review and personal interpretation at Time: 16:03   Indication: cough  Rate: 80   Rhythm: sinus Axis: normal Other: non specific st changes, <87mm st elevation in inferior leads secondary to wandering baseline. No reciprocal changes ____________________________________________  RADIOLOGY  I personally reviewed all radiographic images ordered to evaluate for the above acute complaints and reviewed radiology reports and findings.  These findings were personally discussed with the patient.  Please see medical record for radiology report.  ____________________________________________   PROCEDURES  Procedure(s) performed:  Procedures    Critical Care performed: no ____________________________________________   INITIAL IMPRESSION / ASSESSMENT AND PLAN / ED COURSE  Pertinent labs & imaging results that were available  during my care of the patient were reviewed by me and considered in my medical decision making (see chart for details).  DDX: Asthma, copd, CHF, pna, ptx, malignancy,  anemia   CRAIG WISNEWSKI is a 76 y.o. who presents to the ED with long history of smoking use presents with cough as described above. A febrile mild tachycardia but otherwise well perfused. Hemodynamically stable. No pleuritic chest pain and given her diffuse wheezing this is concerning for COPD exacerbation or bronchitis. Atypical chest pain not consistent with ACS.  Will give nebs, steroids and reassess. The patient will be placed on continuous pulse oximetry and telemetry for monitoring.  Laboratory evaluation will be sent to evaluate for the above complaints.       ----------------------------------------- 7:53 PM on 06/07/2016 -----------------------------------------  Patient was significant improvement after nebulizer treatment. Repeat BMP does confirm that her potassium is normal and likely previous abnormality was secondary to hemolysis. Blood work is otherwise reassuring. We'll start patient on doxycycline for productive cough and the setting of what I do suspect is acute  bronchitis. We'll provide prescription for albuterol as well as steroids.  Have discussed with the patient and available family all diagnostics and treatments performed thus far and all questions were answered to the best of my ability. The patient demonstrates understanding and agreement with plan.  ____________________________________________   FINAL CLINICAL IMPRESSION(S) / ED DIAGNOSES  Final diagnoses:  Acute bronchitis, unspecified organism      NEW MEDICATIONS STARTED DURING THIS VISIT:  New Prescriptions   ALBUTEROL (PROVENTIL HFA;VENTOLIN HFA) 108 (90 BASE) MCG/ACT INHALER    Inhale 2 puffs into the lungs every 6 (six) hours as needed for wheezing or shortness of breath.   DOXYCYCLINE (VIBRAMYCIN) 50 MG CAPSULE    Take 2 capsules (100 mg total) by mouth 2 (two) times daily.   PREDNISONE (DELTASONE) 20 MG TABLET    Take 1 tablet (20 mg total) by mouth daily.     Note:  This document was prepared using Dragon voice recognition software and may include unintentional dictation errors.    Willy Eddy, MD 06/07/16 [phone]

## 2016-07-27 ENCOUNTER — Ambulatory Visit
Admission: RE | Admit: 2016-07-27 | Discharge: 2016-07-27 | Disposition: A | Discharge status: Home / Self Care | Payer: Self-pay | Source: Ambulatory Visit | Attending: Family Medicine | Admitting: Family Medicine

## 2016-07-27 ENCOUNTER — Other Ambulatory Visit (HOSPITAL_COMMUNITY): Payer: Self-pay | Admitting: Family Medicine

## 2016-07-27 DIAGNOSIS — R7611 Nonspecific reaction to tuberculin skin test without active tuberculosis: Secondary | ICD-10-CM | POA: Insufficient documentation

## 2016-07-27 DIAGNOSIS — J90 Pleural effusion, not elsewhere classified: Secondary | ICD-10-CM | POA: Insufficient documentation

## 2017-10-22 ENCOUNTER — Encounter: Payer: Self-pay | Admitting: Emergency Medicine

## 2017-10-22 ENCOUNTER — Emergency Department
Admission: EM | Admit: 2017-10-22 | Discharge: 2017-10-22 | Disposition: A | Discharge status: Home / Self Care | Payer: Medicare PPO | Attending: Emergency Medicine | Admitting: Emergency Medicine

## 2017-10-22 DIAGNOSIS — R55 Syncope and collapse: Secondary | ICD-10-CM | POA: Insufficient documentation

## 2017-10-22 DIAGNOSIS — I10 Essential (primary) hypertension: Secondary | ICD-10-CM | POA: Diagnosis not present

## 2017-10-22 DIAGNOSIS — F17210 Nicotine dependence, cigarettes, uncomplicated: Secondary | ICD-10-CM | POA: Insufficient documentation

## 2017-10-22 LAB — CBC
HEMATOCRIT: 31.9 % — AB (ref 35.0–47.0)
HEMOGLOBIN: 10.5 g/dL — AB (ref 12.0–16.0)
MCH: 27 pg (ref 26.0–34.0)
MCHC: 33 g/dL (ref 32.0–36.0)
MCV: 81.8 fL (ref 80.0–100.0)
Platelets: 202 10*3/uL (ref 150–440)
RBC: 3.9 MIL/uL (ref 3.80–5.20)
RDW: 16 % — ABNORMAL HIGH (ref 11.5–14.5)
WBC: 4.5 10*3/uL (ref 3.6–11.0)

## 2017-10-22 LAB — BASIC METABOLIC PANEL
ANION GAP: 9 (ref 5–15)
BUN: 20 mg/dL (ref 8–23)
CO2: 24 mmol/L (ref 22–32)
Calcium: 8.2 mg/dL — ABNORMAL LOW (ref 8.9–10.3)
Chloride: 107 mmol/L (ref 98–111)
Creatinine, Ser: 0.97 mg/dL (ref 0.44–1.00)
GFR calc non Af Amer: 55 mL/min — ABNORMAL LOW (ref >60)
GLUCOSE: 107 mg/dL — AB (ref 70–99)
POTASSIUM: 3.9 mmol/L (ref 3.5–5.1)
Sodium: 140 mmol/L (ref 135–145)

## 2017-10-22 LAB — TROPONIN I

## 2017-10-22 NOTE — ED Provider Notes (Signed)
[facility] Emergency Department Provider Note       Time seen: ----------------------------------------- 12:35 PM on 10/22/2017 -----------------------------------------   I have reviewed the triage vital signs and the nursing notes.  HISTORY   Chief Complaint Loss of Consciousness    HPI Amber Vasquez is a 77 y.o. female with a history of hypertension who presents to the ED for syncope.  Patient was at work and passed out.  Patient states she did not fall coworker kind of sat her down.  She states she got angry and she was arguing with someone just before the event occurred.  She has no complaints at this time.  Past Medical History:  Diagnosis Date  . Hypertension     There are no active problems to display for this patient.   Past Surgical History:  Procedure Laterality Date  . ABDOMINAL HYSTERECTOMY      Allergies Patient has no known allergies.  Social History Social History   Tobacco Use  . Smoking status: Current Some Day Smoker    Packs/day: 0.50    Types: Cigarettes  . Smokeless tobacco: Never Used  Substance Use Topics  . Alcohol use: Yes  . Drug use: Not on file   Review of Systems Constitutional: Negative for fever. Cardiovascular: Negative for chest pain. Respiratory: Negative for shortness of breath. Gastrointestinal: Negative for abdominal pain, vomiting and diarrhea. Musculoskeletal: Negative for back pain. Skin: Negative for rash. Neurological: Negative for headaches, focal weakness or numbness.  All systems negative/normal/unremarkable except as stated in the HPI  ____________________________________________   PHYSICAL EXAM:  VITAL SIGNS: ED Triage Vitals  Enc Vitals Group     BP 10/22/17 0945 137/62     Pulse Rate 10/22/17 0945 79     Resp 10/22/17 0945 16     Temp 10/22/17 0945 98.3 F (36.8 C)     Temp Source 10/22/17 0945 Oral     SpO2 10/22/17 0945 95 %     Weight 10/22/17 1006 130 lb (59 kg)    Height 10/22/17 1006 5\' 9"  (1.753 m)     Head Circumference --      Peak Flow --      Pain Score 10/22/17 1009 8     Pain Loc --      Pain Edu? --      Excl. in GC? --    Constitutional: Alert and oriented. Well appearing and in no distress. Eyes: Conjunctivae are normal. Normal extraocular movements. ENT   Head: Normocephalic and atraumatic.   Nose: No congestion/rhinnorhea.   Mouth/Throat: Mucous membranes are moist.   Neck: No stridor. Cardiovascular: Normal rate, regular rhythm. No murmurs, rubs, or gallops. Respiratory: Normal respiratory effort without tachypnea nor retractions. Breath sounds are clear and equal bilaterally. No wheezes/rales/rhonchi. Gastrointestinal: Soft and nontender. Normal bowel sounds Musculoskeletal: Nontender with normal range of motion in extremities. No lower extremity tenderness nor edema. Neurologic:  Normal speech and language. No gross focal neurologic deficits are appreciated.  Skin:  Skin is warm, dry and intact. No rash noted. Psychiatric: Mood and affect are normal. Speech and behavior are normal.  ____________________________________________  EKG: Interpreted by me.  Sinus rhythm the rate of 78 bpm, LVH, normal axis, normal QT  ____________________________________________  ED COURSE:  As part of my medical decision making, I reviewed the following data within the electronic MEDICAL RECORD NUMBER History obtained from family if available, nursing notes, old chart and ekg, as well as notes from prior ED visits. Patient  presented for syncope, we will assess with labs and imaging as indicated at this time.   Procedures ____________________________________________   LABS (pertinent positives/negatives)  Labs Reviewed  CBC - Abnormal; Notable for the following components:      Result Value   Hemoglobin 10.5 (*)    HCT 31.9 (*)    RDW 16.0 (*)    All other components within normal limits  BASIC METABOLIC PANEL - Abnormal; Notable  for the following components:   Glucose, Bld 107 (*)    Calcium 8.2 (*)    GFR calc non Af Amer 55 (*)    All other components within normal limits  TROPONIN I  URINALYSIS, COMPLETE (UACMP) WITH MICROSCOPIC  CBG MONITORING, ED  ____________________________________________  DIFFERENTIAL DIAGNOSIS   Arrhythmia, vasovagal event, hypoglycemia, occult infection, MI  FINAL ASSESSMENT AND PLAN  Near syncope   Plan: The patient had presented for a near syncopal event. Patient's labs do not reveal any acute process.  She did not require any imaging during her ER visit.  I think she likely had a vasovagal type event due to the argument that she was in and she states she got too stressed out.  Currently she is not orthostatic and has no complaints and is cleared for outpatient follow-up.   Ulice DashJohnathan E Williams, MD   Note: This note was generated in part or whole with voice recognition software. Voice recognition is usually quite accurate but there are transcription errors that can and very often do occur. I apologize for any typographical errors that were not detected and corrected.     Emily FilbertWilliams, Jonathan E, MD 10/22/17 1247

## 2017-10-22 NOTE — ED Triage Notes (Signed)
Pt reports was at work and passed out. Pt states she did not fall, a co-worker sat her down. Pt c/o pain to head and right wrist.

## 2017-10-22 NOTE — ED Notes (Addendum)
First Nurse Note: Patient states she got angry and had syncopal episode at work.  Complaining of HA and chest pain.  VSS, skin warm and dry.

## 2017-11-22 ENCOUNTER — Other Ambulatory Visit: Payer: Self-pay

## 2017-11-22 ENCOUNTER — Emergency Department: Payer: Medicare Other

## 2017-11-22 ENCOUNTER — Inpatient Hospital Stay
Admission: EM | Admit: 2017-11-22 | Discharge: 2017-11-24 | DRG: 193 | Disposition: A | Discharge status: Home / Self Care | Payer: Medicare Other | Attending: Internal Medicine | Admitting: Internal Medicine

## 2017-11-22 ENCOUNTER — Encounter: Payer: Self-pay | Admitting: Emergency Medicine

## 2017-11-22 DIAGNOSIS — X58XXXA Exposure to other specified factors, initial encounter: Secondary | ICD-10-CM | POA: Diagnosis present

## 2017-11-22 DIAGNOSIS — Z681 Body mass index (BMI) 19 or less, adult: Secondary | ICD-10-CM | POA: Diagnosis not present

## 2017-11-22 DIAGNOSIS — J189 Pneumonia, unspecified organism: Principal | ICD-10-CM | POA: Diagnosis present

## 2017-11-22 DIAGNOSIS — Z66 Do not resuscitate: Secondary | ICD-10-CM | POA: Diagnosis not present

## 2017-11-22 DIAGNOSIS — T17890A Other foreign object in other parts of respiratory tract causing asphyxiation, initial encounter: Secondary | ICD-10-CM | POA: Diagnosis present

## 2017-11-22 DIAGNOSIS — J440 Chronic obstructive pulmonary disease with acute lower respiratory infection: Secondary | ICD-10-CM | POA: Diagnosis not present

## 2017-11-22 DIAGNOSIS — F17210 Nicotine dependence, cigarettes, uncomplicated: Secondary | ICD-10-CM | POA: Diagnosis present

## 2017-11-22 DIAGNOSIS — I10 Essential (primary) hypertension: Secondary | ICD-10-CM | POA: Diagnosis not present

## 2017-11-22 DIAGNOSIS — N179 Acute kidney failure, unspecified: Secondary | ICD-10-CM

## 2017-11-22 DIAGNOSIS — Z79899 Other long term (current) drug therapy: Secondary | ICD-10-CM | POA: Diagnosis not present

## 2017-11-22 DIAGNOSIS — E43 Unspecified severe protein-calorie malnutrition: Secondary | ICD-10-CM | POA: Diagnosis not present

## 2017-11-22 DIAGNOSIS — J181 Lobar pneumonia, unspecified organism: Secondary | ICD-10-CM

## 2017-11-22 DIAGNOSIS — R9431 Abnormal electrocardiogram [ECG] [EKG]: Secondary | ICD-10-CM | POA: Diagnosis not present

## 2017-11-22 HISTORY — DX: Unspecified asthma, uncomplicated: J45.909

## 2017-11-22 LAB — CBC
HCT: 34 % — ABNORMAL LOW (ref 35.0–47.0)
Hemoglobin: 11.3 g/dL — ABNORMAL LOW (ref 12.0–16.0)
MCH: 27.7 pg (ref 26.0–34.0)
MCHC: 33.1 g/dL (ref 32.0–36.0)
MCV: 83.8 fL (ref 80.0–100.0)
PLATELETS: 237 10*3/uL (ref 150–440)
RBC: 4.06 MIL/uL (ref 3.80–5.20)
RDW: 15.3 % — ABNORMAL HIGH (ref 11.5–14.5)
WBC: 9.4 10*3/uL (ref 3.6–11.0)

## 2017-11-22 LAB — COMPREHENSIVE METABOLIC PANEL
ALT: 7 U/L (ref 0–44)
ANION GAP: 12 (ref 5–15)
AST: 19 U/L (ref 15–41)
Albumin: 3.9 g/dL (ref 3.5–5.0)
Alkaline Phosphatase: 81 U/L (ref 38–126)
BUN: 20 mg/dL (ref 8–23)
CALCIUM: 9.2 mg/dL (ref 8.9–10.3)
CO2: 24 mmol/L (ref 22–32)
Chloride: 106 mmol/L (ref 98–111)
Creatinine, Ser: 1.16 mg/dL — ABNORMAL HIGH (ref 0.44–1.00)
GFR, EST AFRICAN AMERICAN: 52 mL/min — AB (ref >60)
GFR, EST NON AFRICAN AMERICAN: 45 mL/min — AB (ref >60)
Glucose, Bld: 106 mg/dL — ABNORMAL HIGH (ref 70–99)
Potassium: 4 mmol/L (ref 3.5–5.1)
SODIUM: 142 mmol/L (ref 135–145)
Total Bilirubin: 0.7 mg/dL (ref 0.3–1.2)
Total Protein: 7.3 g/dL (ref 6.5–8.1)

## 2017-11-22 MED ORDER — ENSURE ENLIVE PO LIQD: 237.0000 mL | Freq: Every evening | ORAL | Status: DC | PRN

## 2017-11-22 MED ORDER — POLYETHYLENE GLYCOL 3350 17 G PO PACK: 17.0000 g | PACK | Freq: Every day | ORAL | Status: DC | PRN

## 2017-11-22 MED ORDER — LEVOFLOXACIN IN D5W 500 MG/100ML IV SOLN
500.0000 mg | Freq: Once | INTRAVENOUS | Status: AC
  Administered 2017-11-22: 500 mg via INTRAVENOUS
  Filled 2017-11-22: qty 100

## 2017-11-22 MED ORDER — DM-GUAIFENESIN ER 30-600 MG PO TB12
1.0000 | ORAL_TABLET | Freq: Two times a day (BID) | ORAL | Status: DC
  Filled 2017-11-22: qty 1

## 2017-11-22 MED ORDER — ACETAMINOPHEN 650 MG RE SUPP: 650.0000 mg | Freq: Four times a day (QID) | RECTAL | Status: DC | PRN

## 2017-11-22 MED ORDER — ALBUTEROL SULFATE (2.5 MG/3ML) 0.083% IN NEBU
2.5000 mg | INHALATION_SOLUTION | RESPIRATORY_TRACT | Status: DC | PRN
  Administered 2017-11-22 – 2017-11-23 (×2): 2.5 mg via RESPIRATORY_TRACT
  Filled 2017-11-22 (×2): qty 3

## 2017-11-22 MED ORDER — ACETAMINOPHEN 325 MG PO TABS
650.0000 mg | ORAL_TABLET | Freq: Four times a day (QID) | ORAL | Status: DC | PRN
  Administered 2017-11-23: 05:00:00 650 mg via ORAL
  Filled 2017-11-22: qty 2

## 2017-11-22 MED ORDER — SODIUM CHLORIDE 0.9 % IV SOLN
1.0000 g | Freq: Once | INTRAVENOUS | Status: DC
  Filled 2017-11-22: qty 10

## 2017-11-22 MED ORDER — SODIUM CHLORIDE 0.9 % IV SOLN
Freq: Once | INTRAVENOUS | Status: AC
  Administered 2017-11-22: 21:00:00 via INTRAVENOUS

## 2017-11-22 MED ORDER — SODIUM CHLORIDE 0.9 % IV BOLUS
1000.0000 mL | Freq: Once | INTRAVENOUS | Status: AC
  Administered 2017-11-22: 1000 mL via INTRAVENOUS

## 2017-11-22 MED ORDER — ONDANSETRON HCL 4 MG PO TABS: 4.0000 mg | ORAL_TABLET | Freq: Four times a day (QID) | ORAL | Status: DC | PRN

## 2017-11-22 MED ORDER — IPRATROPIUM-ALBUTEROL 0.5-2.5 (3) MG/3ML IN SOLN
3.0000 mL | Freq: Once | RESPIRATORY_TRACT | Status: AC
  Administered 2017-11-22: 3 mL via RESPIRATORY_TRACT
  Filled 2017-11-22: qty 3

## 2017-11-22 MED ORDER — AMLODIPINE BESYLATE 5 MG PO TABS
5.0000 mg | ORAL_TABLET | Freq: Every day | ORAL | Status: DC
  Administered 2017-11-22: 19:00:00 5 mg via ORAL
  Filled 2017-11-22: qty 1

## 2017-11-22 MED ORDER — SODIUM CHLORIDE 0.9 % IV SOLN
1.0000 g | INTRAVENOUS | Status: DC
  Administered 2017-11-22 – 2017-11-23 (×2): 1 g via INTRAVENOUS
  Filled 2017-11-22 (×2): qty 10
  Filled 2017-11-22: qty 1

## 2017-11-22 MED ORDER — NICOTINE 21 MG/24HR TD PT24
21.0000 mg | MEDICATED_PATCH | Freq: Every day | TRANSDERMAL | Status: DC
  Administered 2017-11-23 – 2017-11-24 (×3): 21 mg via TRANSDERMAL
  Filled 2017-11-22 (×3): qty 1

## 2017-11-22 MED ORDER — DEXTROMETHORPHAN POLISTIREX ER 30 MG/5ML PO SUER
30.0000 mg | Freq: Two times a day (BID) | ORAL | Status: DC
  Administered 2017-11-23: 30 mg via ORAL
  Filled 2017-11-22 (×5): qty 5

## 2017-11-22 MED ORDER — IOHEXOL 300 MG/ML  SOLN
75.0000 mL | Freq: Once | INTRAMUSCULAR | Status: AC | PRN
  Administered 2017-11-22: 75 mL via INTRAVENOUS
  Filled 2017-11-22: qty 75

## 2017-11-22 MED ORDER — ACETYLCYSTEINE 20 % IN SOLN
4.0000 mL | Freq: Two times a day (BID) | RESPIRATORY_TRACT | Status: DC
  Administered 2017-11-22 – 2017-11-23 (×2): 4 mL via RESPIRATORY_TRACT
  Filled 2017-11-22 (×2): qty 4

## 2017-11-22 MED ORDER — DOXYCYCLINE HYCLATE 100 MG PO TABS
100.0000 mg | ORAL_TABLET | Freq: Two times a day (BID) | ORAL | Status: DC
  Administered 2017-11-22 – 2017-11-24 (×4): 100 mg via ORAL
  Filled 2017-11-22 (×5): qty 1

## 2017-11-22 MED ORDER — ENOXAPARIN SODIUM 40 MG/0.4ML ~~LOC~~ SOLN
40.0000 mg | SUBCUTANEOUS | Status: DC
  Administered 2017-11-22 – 2017-11-23 (×2): 40 mg via SUBCUTANEOUS
  Filled 2017-11-22 (×2): qty 0.4

## 2017-11-22 MED ORDER — GUAIFENESIN ER 600 MG PO TB12
600.0000 mg | ORAL_TABLET | Freq: Two times a day (BID) | ORAL | Status: DC
  Administered 2017-11-22 – 2017-11-24 (×3): 600 mg via ORAL
  Filled 2017-11-22 (×4): qty 1

## 2017-11-22 MED ORDER — ONDANSETRON HCL 4 MG/2ML IJ SOLN: 4.0000 mg | Freq: Four times a day (QID) | INTRAMUSCULAR | Status: DC | PRN

## 2017-11-22 NOTE — H&P (Signed)
[facility] - [place] at [facility]   PATIENT NAME: Amber Vasquez    MR#:  [id number]  DATE OF BIRTH:  1940/07/02  DATE OF ADMISSION:  11/22/2017  PRIMARY CARE PHYSICIAN: Patient, No Pcp Per   REQUESTING/REFERRING PHYSICIAN: Dr Alphonzo Lemmings ([place] informed me)  CHIEF COMPLAINT:  cough for two weeks  HISTORY OF PRESENT ILLNESS:  Amber Vasquez  is a 77 y.o. female with a known history of pretension, asthma not on any medication, tobacco abuse comes to the emergency room with increasing cough not feeling well and weakness with dizziness. Workup in the ER showed patient has right middle lobe pneumonia with mucus impaction-multifocal. Patient just finished the course of azithromycin for five days. She did it about a week ago.  Denies any fever any nausea vomiting. It is being admitted for right middle lobe pneumonia.  She tells me she takes three or four drinks of gin every day in the evening before going to bed time she does not have any alcohol withdrawal history or alcohol-related health issues. PAST MEDICAL HISTORY:   Past Medical History:  Diagnosis Date  . Asthma   . Hypertension     PAST SURGICAL HISTOIRY:   Past Surgical History:  Procedure Laterality Date  . ABDOMINAL HYSTERECTOMY      SOCIAL HISTORY:   Social History   Tobacco Use  . Smoking status: Current Some Day Smoker    Packs/day: 0.50    Types: Cigarettes  . Smokeless tobacco: Never Used  Substance Use Topics  . Alcohol use: Yes    FAMILY HISTORY:   Family History  Problem Relation Age of Onset  . Hypertension Mother     DRUG ALLERGIES:  No Known Allergies  REVIEW OF SYSTEMS:  Review of Systems  Constitutional: Negative for chills, fever and weight loss.  HENT: Negative for ear discharge, ear pain and nosebleeds.   Eyes: Negative for blurred vision, pain and discharge.  Respiratory: Positive for cough and sputum production. Negative for shortness of breath, wheezing and  stridor.   Cardiovascular: Negative for chest pain, palpitations, orthopnea and PND.  Gastrointestinal: Negative for abdominal pain, diarrhea, nausea and vomiting.  Genitourinary: Negative for frequency and urgency.  Musculoskeletal: Negative for back pain and joint pain.  Neurological: Positive for dizziness and weakness. Negative for sensory change, speech change and focal weakness.  Psychiatric/Behavioral: Negative for depression and hallucinations. The patient is not nervous/anxious.      MEDICATIONS AT HOME:   Prior to Admission medications   Medication Sig Start Date End Date Taking? Authorizing Provider  albuterol (PROVENTIL HFA;VENTOLIN HFA) 108 (90 Base) MCG/ACT inhaler Inhale 2 puffs into the lungs every 6 (six) hours as needed for wheezing or shortness of breath. 06/07/16   Willy Eddy, MD  hydrochlorothiazide (HYDRODIURIL) 25 MG tablet Take 1 tablet (25 mg total) by mouth daily. 03/23/15   Governor Rooks, MD      VITAL SIGNS:  Blood pressure (!) 172/79, pulse 79, temperature 98.4 F (36.9 C), temperature source Oral, resp. rate 20, height 5\' 9"  (1.753 m), weight 54.9 kg, SpO2 97 %.  PHYSICAL EXAMINATION:  GENERAL:  77 y.o.-year-old patient lying in the bed with no acute distress.  EYES: Pupils equal, round, reactive to light and accommodation. No scleral icterus. Extraocular muscles intact.  HEENT: Head atraumatic, normocephalic. Oropharynx and nasopharynx clear.  NECK:  Supple, no jugular venous distention. No thyroid enlargement, no tenderness.  LUNGS: Normal breath sounds bilaterally, no wheezing, rales,rhonchi or crepitation. No use of  accessory muscles of respiration.  CARDIOVASCULAR: S1, S2 normal. No murmurs, rubs, or gallops.  ABDOMEN: Soft, nontender, nondistended. Bowel sounds present. No organomegaly or mass.  EXTREMITIES: No pedal edema, cyanosis, or clubbing.  NEUROLOGIC: Cranial nerves II through XII are intact. Muscle strength 5/5 in all extremities.  Sensation intact. Gait not checked.  PSYCHIATRIC: The patient is alert and oriented x 3.  SKIN: No obvious rash, lesion, or ulcer.   LABORATORY PANEL:   CBC Recent Labs  Lab 11/22/17 1416  WBC 9.4  HGB 11.3*  HCT 34.0*  PLT 237   ------------------------------------------------------------------------------------------------------------------  Chemistries  Recent Labs  Lab 11/22/17 1416  NA 142  K 4.0  CL 106  CO2 24  GLUCOSE 106*  BUN 20  CREATININE 1.16*  CALCIUM 9.2  AST 19  ALT 7  ALKPHOS 81  BILITOT 0.7   ------------------------------------------------------------------------------------------------------------------  Cardiac Enzymes No results for input(s): TROPONINI in the last 168 hours. ------------------------------------------------------------------------------------------------------------------  RADIOLOGY:  Dg Chest 2 View  Result Date: 11/22/2017 CLINICAL DATA:  Productive cough for 2 weeks.  Smoker EXAM: CHEST - 2 VIEW COMPARISON:  07/27/2016 FINDINGS: Airspace disease in the right middle lobe, best seen on the lateral view. Large lung volumes. There is no edema, effusion, or pneumothorax. Borderline heart size, stable. IMPRESSION: 1. Presumed right middle lobe pneumonia. Followup PA and lateral chest X-ray is recommended in 3-4 weeks following trial of antibiotic therapy to ensure resolution. 2. Large lung volumes, suspect COPD. Electronically Signed   By: Marnee Spring M.D.   On: 11/22/2017 13:43   Ct Chest W Contrast  Result Date: 11/22/2017 CLINICAL DATA:  Cough and fever with pneumonia by radiography EXAM: CT CHEST WITH CONTRAST TECHNIQUE: Multidetector CT imaging of the chest was performed during intravenous contrast administration. CONTRAST:  75mL OMNIPAQUE IOHEXOL 300 MG/ML  SOLN COMPARISON:  Chest radiograph from earlier today FINDINGS: Cardiovascular: Normal heart size. No pericardial effusion. Extensive coronary atherosclerotic  calcification. There is aortic wall atherosclerotic calcification. Mediastinum/Nodes: Negative for adenopathy. Lungs/Pleura: There is generalized airway thickening with patchy mucoid impaction in the lower lobes where there is mild atelectasis. Airspace disease in the right middle lobe along the medial segment bronchovascular tree. No masslike areas, effusion, or cavitation. Upper Abdomen: Partially covered left renal cystic density. Musculoskeletal: Posterior right twelfth rib fracture with callus consistent with healing. No acute or aggressive finding IMPRESSION: 1. Confirmed right middle lobe pneumonia. There is a background of bronchitis and multifocal impaction mucoid impaction. Followup PA and lateral chest X-ray is recommended in 3-4 weeks to ensure resolution. 2. Extensive coronary atherosclerosis. 3. Healing right twelfth rib fracture. Electronically Signed   By: Marnee Spring M.D.   On: 11/22/2017 16:24    EKG:    IMPRESSION AND PLAN:    Marylouise Mallet  is a 77 y.o. female with a known history of pretension, asthma not on any medication, tobacco abuse comes to the emergency room with increasing cough not feeling well and weakness with dizziness. Workup in the ER showed patient has right middle lobe pneumonia with mucus impaction-multifocal. Patient just finished the course of azithromycin for five days. She did it about a week ago.  1. right middle lobe pneumonia with mucus and impaction multifocal -failed outpatient treatment. She finished the course of Zithromax a week ago. -CT chest confirms above -pulmonary consultation. Patient does not have a primary care and with her history of tobacco abuse will have her follow-up by pulmonology both in-house and outpatient -IV Rocephin and doxycycline -oral Mucinex  BID -mucomyst nebulizer BID  2. hypertension untreated -start amlodipine  3. Tobacco abuse smoking cessation counsel. Patient agrees on smoking cessation she is using Nicorette  gum  4. History of alcohol use. Patient drinks for vodka/gin about 3-4 drinks.  5.DVT prophylaxis lovenox    All the records are reviewed and case discussed with ED provider. Management plans discussed with the patient, family and they are in agreement.  CODE STATUS: DNR  TOTAL TIME TAKING CARE OF THIS PATIENT: *50* minutes.    Enedina Finner M.D on 11/22/2017 at 5:17 PM  Between 7am to 6pm - Pager - [phone]  After 6pm go to [email or web address] - password [email or web address] [facility]  [facility]  Office  [phone]  CC: Primary care physician; Patient, No Pcp Per

## 2017-11-22 NOTE — ED Triage Notes (Signed)
Pt arrived via POV with reports of cough x 2 weeks, pt states she was on a zpack that she completed last week, states the cough is worse at night and first thing in the morning and is disrupting her sleep.  Pt works at [employer] in the [employer] and states they won't let her work because of the cough.  Pt states she was also prescribed tessalon perles but are not helping.

## 2017-11-22 NOTE — ED Provider Notes (Signed)
ED ECG REPORT I, Arelia Longest, the attending physician, personally viewed and interpreted this ECG.   Date: 11/22/2017  EKG Time: 1511  Rate: 80  Rhythm: normal sinus rhythm  Axis: Normal  Intervals:none  ST&T Change: No ST segment elevation or depression.  No abnormal T wave inversion.    Myrna Blazer, MD 11/22/17 [phone]

## 2017-11-22 NOTE — Progress Notes (Signed)
Family Meeting Note  Advance Directive yes   Today a meeting took place with the patient and the emergency room. No family present. Patient comes in with cough that is been there for two weeks she took a course of antibiotic as outpatient. She is found to have right middle lobe pneumonia. She has a history of asthma/COPD with ongoing tobacco abuse. Discuss code status with patient. She request DNR.  prognosis stable. Time spent 16 minutes.  Enedina Finner, MD

## 2017-11-22 NOTE — ED Provider Notes (Signed)
[facility] Emergency Department Provider Note  ____________________________________________  Time seen: Approximately 3:21 PM  I have reviewed the triage vital signs and the nursing notes.   HISTORY  Chief Complaint Cough    HPI Amber Vasquez is a 77 y.o. female that presents emergency department for evaluation of weakness, feeling tired, occasionally productive cough, shortness of breath for 8 days.  Patient states that she had a fever the first day but has not felt like she has had a fever since.  She originally had some nasal congestion, but this has resolved.  She finished a course of azithromycin 2 days ago, but symptoms have not improved.  She smokes a half a pack of cigarettes per day.  She does not have primary care and does not believe in medication.  No chest pain, nausea, vomiting, abdominal pain.  Past Medical History:  Diagnosis Date  . Asthma   . Hypertension     There are no active problems to display for this patient.   Past Surgical History:  Procedure Laterality Date  . ABDOMINAL HYSTERECTOMY      Prior to Admission medications   Medication Sig Start Date End Date Taking? Authorizing Provider  albuterol (PROVENTIL HFA;VENTOLIN HFA) 108 (90 Base) MCG/ACT inhaler Inhale 2 puffs into the lungs every 6 (six) hours as needed for wheezing or shortness of breath. 06/07/16   Willy Eddy, MD  hydrochlorothiazide (HYDRODIURIL) 25 MG tablet Take 1 tablet (25 mg total) by mouth daily. 03/23/15   Governor Rooks, MD    Allergies Patient has no known allergies.  Family History  Problem Relation Age of Onset  . Hypertension Mother     Social History Social History   Tobacco Use  . Smoking status: Current Some Day Smoker    Packs/day: 0.50    Types: Cigarettes  . Smokeless tobacco: Never Used  Substance Use Topics  . Alcohol use: Yes  . Drug use: Not on file     Review of Systems  Constitutional: No fever/chills Eyes: No visual  changes. No discharge. ENT: Negative for congestion and rhinorrhea. Cardiovascular: No chest pain. Respiratory: Positive for cough and SOB. Gastrointestinal: No abdominal pain.  No nausea, no vomiting.  No diarrhea.  No constipation. Musculoskeletal: Negative for musculoskeletal pain. Skin: Negative for rash, abrasions, lacerations, ecchymosis. Neurological: Negative for headaches.   ____________________________________________   PHYSICAL EXAM:  VITAL SIGNS: ED Triage Vitals  Enc Vitals Group     BP 11/22/17 1259 (!) 175/84     Pulse Rate 11/22/17 1259 84     Resp 11/22/17 1259 18     Temp 11/22/17 1259 98.4 F (36.9 C)     Temp Source 11/22/17 1259 Oral     SpO2 11/22/17 1259 98 %     Weight 11/22/17 1300 121 lb (54.9 kg)     Height 11/22/17 1300 5\' 9"  (1.753 m)     Head Circumference --      Peak Flow --      Pain Score 11/22/17 1300 8     Pain Loc --      Pain Edu? --      Excl. in GC? --      Constitutional: Alert and oriented. Well appearing and in no acute distress. Eyes: Conjunctivae are normal. PERRL. EOMI. No discharge. Head: Atraumatic. ENT: No frontal and maxillary sinus tenderness.      Ears: Tympanic membranes pearly gray with good landmarks. No discharge.      Nose: No congestion/rhinnorhea.  Mouth/Throat: Mucous membranes are moist. Oropharynx non-erythematous. Tonsils not enlarged. No exudates. Uvula midline. Neck: No stridor.   Hematological/Lymphatic/Immunilogical: No cervical lymphadenopathy. Cardiovascular: Normal rate, regular rhythm.  Good peripheral circulation. Respiratory: Normal respiratory effort without tachypnea or retractions. Lungs CTAB. Good air entry to the bases with no decreased or absent breath sounds. Gastrointestinal: Bowel sounds 4 quadrants. Soft and nontender to palpation. No guarding or rigidity. No palpable masses. No distention. Musculoskeletal: Full range of motion to all extremities. No gross deformities  appreciated. Neurologic:  Normal speech and language. No gross focal neurologic deficits are appreciated.  Skin:  Skin is warm, dry and intact. No rash noted. Psychiatric: Mood and affect are normal. Speech and behavior are normal. Patient exhibits appropriate insight and judgement.   ____________________________________________   LABS (all labs ordered are listed, but only abnormal results are displayed)  Labs Reviewed  CBC - Abnormal; Notable for the following components:      Result Value   Hemoglobin 11.3 (*)    HCT 34.0 (*)    RDW 15.3 (*)    All other components within normal limits  COMPREHENSIVE METABOLIC PANEL - Abnormal; Notable for the following components:   Glucose, Bld 106 (*)    Creatinine, Ser 1.16 (*)    GFR calc non Af Amer 45 (*)    GFR calc Af Amer 52 (*)    All other components within normal limits   ____________________________________________  EKG   ____________________________________________  RADIOLOGY Lexine Baton, personally viewed and evaluated these images (plain radiographs) as part of my medical decision making, as well as reviewing the written report by the radiologist.  Dg Chest 2 View  Result Date: 11/22/2017 CLINICAL DATA:  Productive cough for 2 weeks.  Smoker EXAM: CHEST - 2 VIEW COMPARISON:  07/27/2016 FINDINGS: Airspace disease in the right middle lobe, best seen on the lateral view. Large lung volumes. There is no edema, effusion, or pneumothorax. Borderline heart size, stable. IMPRESSION: 1. Presumed right middle lobe pneumonia. Followup PA and lateral chest X-ray is recommended in 3-4 weeks following trial of antibiotic therapy to ensure resolution. 2. Large lung volumes, suspect COPD. Electronically Signed   By: Marnee Spring M.D.   On: 11/22/2017 13:43   Ct Chest W Contrast  Result Date: 11/22/2017 CLINICAL DATA:  Cough and fever with pneumonia by radiography EXAM: CT CHEST WITH CONTRAST TECHNIQUE: Multidetector CT imaging of  the chest was performed during intravenous contrast administration. CONTRAST:  75mL OMNIPAQUE IOHEXOL 300 MG/ML  SOLN COMPARISON:  Chest radiograph from earlier today FINDINGS: Cardiovascular: Normal heart size. No pericardial effusion. Extensive coronary atherosclerotic calcification. There is aortic wall atherosclerotic calcification. Mediastinum/Nodes: Negative for adenopathy. Lungs/Pleura: There is generalized airway thickening with patchy mucoid impaction in the lower lobes where there is mild atelectasis. Airspace disease in the right middle lobe along the medial segment bronchovascular tree. No masslike areas, effusion, or cavitation. Upper Abdomen: Partially covered left renal cystic density. Musculoskeletal: Posterior right twelfth rib fracture with callus consistent with healing. No acute or aggressive finding IMPRESSION: 1. Confirmed right middle lobe pneumonia. There is a background of bronchitis and multifocal impaction mucoid impaction. Followup PA and lateral chest X-ray is recommended in 3-4 weeks to ensure resolution. 2. Extensive coronary atherosclerosis. 3. Healing right twelfth rib fracture. Electronically Signed   By: Marnee Spring M.D.   On: 11/22/2017 16:24    ____________________________________________    PROCEDURES  Procedure(s) performed:    Procedures    Medications  sodium chloride 0.9 %  bolus 1,000 mL (0 mLs Intravenous Stopped 11/22/17 1609)  levofloxacin (LEVAQUIN) IVPB 500 mg (0 mg Intravenous Stopped 11/22/17 1603)  ipratropium-albuterol (DUONEB) 0.5-2.5 (3) MG/3ML nebulizer solution 3 mL (3 mLs Nebulization Given 11/22/17 1456)  iohexol (OMNIPAQUE) 300 MG/ML solution 75 mL (75 mLs Intravenous Contrast Given 11/22/17 1559)     ____________________________________________   INITIAL IMPRESSION / ASSESSMENT AND PLAN / ED COURSE  Pertinent labs & imaging results that were available during my care of the patient were reviewed by me and considered in my medical  decision making (see chart for details).  Review of the [place] CSRS was performed in accordance of the [facility] prior to dispensing any controlled drugs.     Patient presented to the emergency department for evaluation of cough, weakness, tired for 8 days.  Chest x-ray concerning for pneumonia.  Labs remarkable for AKI.  Patient has failed outpatient therapy.  IV fluids and Levaquin were given.  Chest CT was ordered to rule out malignancy.  Report was given to Dr. Allena Katz who is agreeable to admission.    ____________________________________________  FINAL CLINICAL IMPRESSION(S) / ED DIAGNOSES  Final diagnoses:  Community acquired pneumonia of right middle lobe of lung (HCC)  AKI (acute kidney injury) (HCC)      NEW MEDICATIONS STARTED DURING THIS VISIT:  ED Discharge Orders    None          This chart was dictated using voice recognition software/Dragon. Despite best efforts to proofread, errors can occur which can change the meaning. Any change was purely unintentional.    Enid Derry, PA-C 11/22/17 1632    Jeanmarie Plant, MD 11/23/17 [phone]

## 2017-11-23 DIAGNOSIS — J181 Lobar pneumonia, unspecified organism: Secondary | ICD-10-CM

## 2017-11-23 DIAGNOSIS — Z79899 Other long term (current) drug therapy: Secondary | ICD-10-CM | POA: Diagnosis not present

## 2017-11-23 DIAGNOSIS — F17210 Nicotine dependence, cigarettes, uncomplicated: Secondary | ICD-10-CM | POA: Diagnosis not present

## 2017-11-23 DIAGNOSIS — J189 Pneumonia, unspecified organism: Secondary | ICD-10-CM | POA: Diagnosis not present

## 2017-11-23 DIAGNOSIS — J440 Chronic obstructive pulmonary disease with acute lower respiratory infection: Secondary | ICD-10-CM | POA: Diagnosis not present

## 2017-11-23 DIAGNOSIS — Z681 Body mass index (BMI) 19 or less, adult: Secondary | ICD-10-CM | POA: Diagnosis not present

## 2017-11-23 DIAGNOSIS — X58XXXA Exposure to other specified factors, initial encounter: Secondary | ICD-10-CM | POA: Diagnosis not present

## 2017-11-23 DIAGNOSIS — R9431 Abnormal electrocardiogram [ECG] [EKG]: Secondary | ICD-10-CM | POA: Diagnosis not present

## 2017-11-23 DIAGNOSIS — I10 Essential (primary) hypertension: Secondary | ICD-10-CM | POA: Diagnosis not present

## 2017-11-23 DIAGNOSIS — T17890A Other foreign object in other parts of respiratory tract causing asphyxiation, initial encounter: Secondary | ICD-10-CM | POA: Diagnosis not present

## 2017-11-23 DIAGNOSIS — Z66 Do not resuscitate: Secondary | ICD-10-CM | POA: Diagnosis not present

## 2017-11-23 DIAGNOSIS — E43 Unspecified severe protein-calorie malnutrition: Secondary | ICD-10-CM | POA: Diagnosis not present

## 2017-11-23 DIAGNOSIS — N179 Acute kidney failure, unspecified: Secondary | ICD-10-CM | POA: Diagnosis present

## 2017-11-23 MED ORDER — VITAMIN B-1 100 MG PO TABS
100.0000 mg | ORAL_TABLET | Freq: Every day | ORAL | Status: DC
  Administered 2017-11-24: 100 mg via ORAL
  Filled 2017-11-23: qty 1

## 2017-11-23 MED ORDER — AMLODIPINE BESYLATE 10 MG PO TABS
10.0000 mg | ORAL_TABLET | Freq: Every day | ORAL | Status: DC
  Administered 2017-11-23 – 2017-11-24 (×2): 10 mg via ORAL
  Filled 2017-11-23 (×2): qty 1

## 2017-11-23 MED ORDER — ADULT MULTIVITAMIN W/MINERALS CH
1.0000 | ORAL_TABLET | Freq: Every day | ORAL | Status: DC
  Administered 2017-11-23 – 2017-11-24 (×2): 1 via ORAL
  Filled 2017-11-23 (×2): qty 1

## 2017-11-23 MED ORDER — FOLIC ACID 1 MG PO TABS
1.0000 mg | ORAL_TABLET | Freq: Every day | ORAL | Status: DC
  Administered 2017-11-24: 10:00:00 1 mg via ORAL
  Filled 2017-11-23: qty 1

## 2017-11-23 MED ORDER — IPRATROPIUM-ALBUTEROL 0.5-2.5 (3) MG/3ML IN SOLN
3.0000 mL | Freq: Four times a day (QID) | RESPIRATORY_TRACT | Status: DC
  Administered 2017-11-23 – 2017-11-24 (×3): 3 mL via RESPIRATORY_TRACT
  Filled 2017-11-23 (×3): qty 3

## 2017-11-23 MED ORDER — VITAMIN C 500 MG PO TABS
250.0000 mg | ORAL_TABLET | Freq: Two times a day (BID) | ORAL | Status: DC
  Administered 2017-11-23 – 2017-11-24 (×3): 250 mg via ORAL
  Filled 2017-11-23 (×3): qty 1

## 2017-11-23 NOTE — Consult Note (Signed)
Reason for Consult: Pneumonia Referring Physician: Doylene Canning.  Note that I was not notified about the consultation. Incidentally found about the consultation on review of the [place] [facility] in patient list.  Amber Vasquez is an 77 y.o. female.  HPI: The patient is a 76 year old smoker, who presented to [facility] after a two-week history of nonproductive cough and generalized malaise. The patient states that she was treated with Azithromycin for the symptoms after being evaluated at urgent care. She does not describe any fevers, chills or sweats associated with the symptoms. She continued to feel generalized malaise and had persistent nonproductive cough and decided to present to the emergency room. She was noted to have a right middle lobe pneumonia. She states that since admission her cough has become more productive and she feels that her chest is "less tight". She has not had any hemoptysis, no chest pain per se with the exception of the tightness described.   She states that she was diagnosed with "asthma" 2 to 3 years ago however she is not on any maintenance inhalers and does not take any medications for pulmonary issues. She states she does not have a primary care physician.   A CT scan of the chest notes confirmation of the right middle lobe infiltrated the patient also has evidence of chronic bronchitis changes in mucus plugging this can be seen with asthma, chronic bronchitis or a combination of the two (asthma/COPD overlap syndrome).  Past Medical History:  Diagnosis Date  . Asthma   . Hypertension     Past Surgical History:  Procedure Laterality Date  . ABDOMINAL HYSTERECTOMY      Family History  Problem Relation Age of Onset  . Hypertension Mother     Social History:  reports that she has been smoking cigarettes. She has been smoking about 0.50 packs per day. She has never used smokeless tobacco. She reports that she drinks alcohol. Her drug history is not on file. The  patient states that she has been smoking half a pack of cigarettes since 20-May-2001. Prior to that she had quit for 10 years. She smoked briefly before that. She resumed smoking after the death of her husband in May 20, 2001. She works at [employer] in the [place]. She has been working in that facility for 17 years.  Allergies: No Known Allergies  Medications: I have reviewed the patient's current medications.  Results for orders placed or performed during the hospital encounter of 11/22/17 (from the past 48 hour(s))  CBC     Status: Abnormal   Collection Time: 11/22/17  2:16 PM  Result Value Ref Range   WBC 9.4 3.6 - 11.0 K/uL   RBC 4.06 3.80 - 5.20 MIL/uL   Hemoglobin 11.3 (L) 12.0 - 16.0 g/dL   HCT 34.0 (L) 35.0 - 47.0 %   MCV 83.8 80.0 - 100.0 fL   MCH 27.7 26.0 - 34.0 pg   MCHC 33.1 32.0 - 36.0 g/dL   RDW 15.3 (H) 11.5 - 14.5 %   Platelets 237 150 - 440 K/uL    Comment: Performed at [facility], [address] [place], [place] [address]  Comprehensive metabolic panel     Status: Abnormal   Collection Time: 11/22/17  2:16 PM  Result Value Ref Range   Sodium 142 135 - 145 mmol/L   Potassium 4.0 3.5 - 5.1 mmol/L   Chloride 106 98 - 111 mmol/L   CO2 24 22 - 32 mmol/L   Glucose,  Bld 106 (H) 70 - 99 mg/dL   BUN 20 8 - 23 mg/dL   Creatinine, Ser 1.16 (H) 0.44 - 1.00 mg/dL   Calcium 9.2 8.9 - 10.3 mg/dL   Total Protein 7.3 6.5 - 8.1 g/dL   Albumin 3.9 3.5 - 5.0 g/dL   AST 19 15 - 41 U/L   ALT 7 0 - 44 U/L   Alkaline Phosphatase 81 38 - 126 U/L   Total Bilirubin 0.7 0.3 - 1.2 mg/dL   GFR calc non Af Amer 45 (L) >60 mL/min   GFR calc Af Amer 52 (L) >60 mL/min    Comment: (NOTE) The eGFR has been calculated using the CKD EPI equation. This calculation has not been validated in all clinical situations. eGFR's persistently <60 mL/min signify possible Chronic Kidney Disease.    Anion gap 12 5 - 15    Comment: Performed at [facility], [address] [place], [place] [address]    Dg Chest 2 View  Result Date: 11/22/2017 CLINICAL DATA:  Productive cough for 2 weeks.  Smoker EXAM: CHEST - 2 VIEW COMPARISON:  07/27/2016 FINDINGS: Airspace disease in the right middle lobe, best seen on the lateral view. Large lung volumes. There is no edema, effusion, or pneumothorax. Borderline heart size, stable. IMPRESSION: 1. Presumed right middle lobe pneumonia. Followup PA and lateral chest X-ray is recommended in 3-4 weeks following trial of antibiotic therapy to ensure resolution. 2. Large lung volumes, suspect COPD. Electronically Signed   By: Monte Fantasia M.D.   On: 11/22/2017 13:43   Ct Chest W Contrast  Result Date: 11/22/2017 CLINICAL DATA:  Cough and fever with pneumonia by radiography EXAM: CT CHEST WITH CONTRAST TECHNIQUE: Multidetector CT imaging of the chest was performed during intravenous contrast administration. CONTRAST:  41m OMNIPAQUE IOHEXOL 300 MG/ML  SOLN COMPARISON:  Chest radiograph from earlier today FINDINGS: Cardiovascular: Normal heart size. No pericardial effusion. Extensive coronary atherosclerotic calcification. There is aortic wall atherosclerotic calcification. Mediastinum/Nodes: Negative for adenopathy. Lungs/Pleura: There is generalized airway thickening with patchy mucoid impaction in the lower lobes where there is mild atelectasis. Airspace disease in the right middle lobe along the medial segment bronchovascular tree. No masslike areas, effusion, or cavitation. Upper Abdomen: Partially covered left renal cystic density. Musculoskeletal: Posterior right twelfth rib fracture with callus consistent with healing. No acute or aggressive finding IMPRESSION: 1. Confirmed right middle lobe pneumonia. There is a background of bronchitis and multifocal impaction mucoid impaction. Followup PA and lateral chest X-ray is recommended in 3-4 weeks to ensure resolution. 2. Extensive coronary atherosclerosis. 3. Healing right twelfth  rib fracture. Electronically Signed   By: JMonte FantasiaM.D.   On: 11/22/2017 16:24    Review of Systems  Constitutional: Positive for malaise/fatigue. Negative for chills, diaphoresis, fever and weight loss.  HENT: Negative.   Eyes: Negative.   Respiratory: Positive for cough, shortness of breath and wheezing. Negative for hemoptysis and sputum production.   Cardiovascular: Negative.   Gastrointestinal: Negative.   Genitourinary: Negative.   Musculoskeletal: Positive for myalgias.  Skin: Negative for itching and rash.  Neurological: Negative.   Endo/Heme/Allergies: Negative.   Psychiatric/Behavioral: Negative.      Blood pressure (!) 151/67, pulse 75, temperature 98.5 F (36.9 C), temperature source Oral, resp. rate 20, height 5' 9"  (1.753 m), weight 54.8 kg, SpO2 99 %.    Physical Exam  Nursing note and vitals reviewed. Constitutional: She is oriented to person, place, and time. She appears well-developed.  Thin  African-American woman, no acute distress.  HENT:  Head: Normocephalic and atraumatic.  Nose: Nose normal.  Mouth/Throat: Oropharynx is clear and moist. No oropharyngeal exudate.  Eyes: Pupils are equal, round, and reactive to light. Conjunctivae and EOM are normal. No scleral icterus.  Neck: Normal range of motion. Neck supple. No JVD present. No tracheal deviation present. No thyromegaly present.  Cardiovascular: Normal rate, regular rhythm, normal heart sounds and intact distal pulses. Exam reveals no gallop and no friction rub.  No murmur heard. Respiratory: Effort normal. No stridor. No respiratory distress. She has wheezes. She has no rales. She exhibits no tenderness.  Scattered rhonchi throughout.  GI: Soft. Bowel sounds are normal. She exhibits distension. There is no rebound.  Musculoskeletal: Normal range of motion. She exhibits no edema, tenderness or deformity.  Lymphadenopathy:    She has no cervical adenopathy.  Neurological: She is alert and  oriented to person, place, and time. She has normal reflexes. No cranial nerve deficit. She exhibits normal muscle tone.  Skin: Skin is warm and dry. No rash noted. No erythema. No pallor.  Psychiatric: She has a normal mood and affect. Her behavior is normal. Judgment normal.       Assessment/Plan:  1) Right middle lobe pneumonia, community acquired, no organism specified: I agree with Rocephin and doxycycline.  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 Continue expectorants. Consider addition of steroids if she fails to clear with the above interventions. The patient will need ultimately to have PFTs as an outpatient.  3) Tobacco abuse and dependence due to cigarettes, the patient is pre-contemplative with regards to discontinuation of smoking. She was counseled in this regard.  4) Needs primary care as outpatient: we will be happy to follow the patient from the pulmonary standpoint as an outpatient however, we do not do primary care and the patient will need primary care physician to coordinate care.  Thank you for allowing [facility] to participate in this patient's care.    Vernard Gambles 11/23/2017, 4:15 PM

## 2017-11-23 NOTE — Progress Notes (Signed)
[facility] [place] [facility]   PATIENT NAME: Amber Vasquez    MR#:  [id number]  DATE OF BIRTH:  Feb 28, 1940  SUBJECTIVE:  CHIEF COMPLAINT:   Chief Complaint  Patient presents with  . Cough   Came with weakness, had pneumonia and treated as outpatient but did not get better. Feels slightly better today. REVIEW OF SYSTEMS:  CONSTITUTIONAL: No fever, have fatigue or weakness.  EYES: No blurred or double vision.  EARS, NOSE, AND THROAT: No tinnitus or ear pain.  RESPIRATORY: She have cough, shortness of breath,no wheezing or hemoptysis.  CARDIOVASCULAR: No chest pain, orthopnea, edema.  GASTROINTESTINAL: No nausea, vomiting, diarrhea or abdominal pain.  GENITOURINARY: No dysuria, hematuria.  ENDOCRINE: No polyuria, nocturia,  HEMATOLOGY: No anemia, easy bruising or bleeding SKIN: No rash or lesion. MUSCULOSKELETAL: No joint pain or arthritis.   NEUROLOGIC: No tingling, numbness, weakness.  PSYCHIATRY: No anxiety or depression.   ROS  DRUG ALLERGIES:  No Known Allergies  VITALS:  Blood pressure (!) 164/81, pulse 75, temperature 98.5 F (36.9 C), temperature source Oral, resp. rate 16, height 5\' 9"  (1.753 m), weight 54.8 kg, SpO2 95 %.  PHYSICAL EXAMINATION:   GENERAL:  77 y.o.-year-old patient lying in the bed with no acute distress.  EYES: Pupils equal, round, reactive to light and accommodation. No scleral icterus. Extraocular muscles intact.  HEENT: Head atraumatic, normocephalic. Oropharynx and nasopharynx clear.  NECK:  Supple, no jugular venous distention. No thyroid enlargement, no tenderness.  LUNGS: Normal breath sounds bilaterally, no wheezing, rales,rhonchi or crepitation. No use of accessory muscles of respiration.  CARDIOVASCULAR: S1, S2 normal. No murmurs, rubs, or gallops.  ABDOMEN: Soft, nontender, nondistended. Bowel sounds present. No organomegaly or mass.  EXTREMITIES: No pedal edema, cyanosis, or clubbing.  NEUROLOGIC: Cranial nerves  II through XII are intact. Muscle strength 4/5 in all extremities. Sensation intact. Gait not checked.  PSYCHIATRIC: The patient is alert and oriented x 3.  SKIN: No obvious rash, lesion, or ulcer.   Physical Exam LABORATORY PANEL:   CBC Recent Labs  Lab 11/22/17 1416  WBC 9.4  HGB 11.3*  HCT 34.0*  PLT 237   ------------------------------------------------------------------------------------------------------------------  Chemistries  Recent Labs  Lab 11/22/17 1416  NA 142  K 4.0  CL 106  CO2 24  GLUCOSE 106*  BUN 20  CREATININE 1.16*  CALCIUM 9.2  AST 19  ALT 7  ALKPHOS 81  BILITOT 0.7   ------------------------------------------------------------------------------------------------------------------  Cardiac Enzymes No results for input(s): TROPONINI in the last 168 hours. ------------------------------------------------------------------------------------------------------------------  RADIOLOGY:  Dg Chest 2 View  Result Date: 11/22/2017 CLINICAL DATA:  Productive cough for 2 weeks.  Smoker EXAM: CHEST - 2 VIEW COMPARISON:  07/27/2016 FINDINGS: Airspace disease in the right middle lobe, best seen on the lateral view. Large lung volumes. There is no edema, effusion, or pneumothorax. Borderline heart size, stable. IMPRESSION: 1. Presumed right middle lobe pneumonia. Followup PA and lateral chest X-ray is recommended in 3-4 weeks following trial of antibiotic therapy to ensure resolution. 2. Large lung volumes, suspect COPD. Electronically Signed   By: Marnee Spring M.D.   On: 11/22/2017 13:43   Ct Chest W Contrast  Result Date: 11/22/2017 CLINICAL DATA:  Cough and fever with pneumonia by radiography EXAM: CT CHEST WITH CONTRAST TECHNIQUE: Multidetector CT imaging of the chest was performed during intravenous contrast administration. CONTRAST:  75mL OMNIPAQUE IOHEXOL 300 MG/ML  SOLN COMPARISON:  Chest radiograph from earlier today FINDINGS: Cardiovascular: Normal  heart size. No pericardial  effusion. Extensive coronary atherosclerotic calcification. There is aortic wall atherosclerotic calcification. Mediastinum/Nodes: Negative for adenopathy. Lungs/Pleura: There is generalized airway thickening with patchy mucoid impaction in the lower lobes where there is mild atelectasis. Airspace disease in the right middle lobe along the medial segment bronchovascular tree. No masslike areas, effusion, or cavitation. Upper Abdomen: Partially covered left renal cystic density. Musculoskeletal: Posterior right twelfth rib fracture with callus consistent with healing. No acute or aggressive finding IMPRESSION: 1. Confirmed right middle lobe pneumonia. There is a background of bronchitis and multifocal impaction mucoid impaction. Followup PA and lateral chest X-ray is recommended in 3-4 weeks to ensure resolution. 2. Extensive coronary atherosclerosis. 3. Healing right twelfth rib fracture. Electronically Signed   By: Marnee Spring M.D.   On: 11/22/2017 16:24    ASSESSMENT AND PLAN:   Active Problems:   Pneumonia  Amber Vasquez  is a 77 y.o. female with a known history of pretension, asthma not on any medication, tobacco abuse comes to the emergency room with increasing cough not feeling well and weakness with dizziness. Workup in the ER showed patient has right middle lobe pneumonia with mucus impaction-multifocal. Patient just finished the course of azithromycin for five days. She did it about a week ago.  1. right middle lobe pneumonia with mucus and impaction multifocal -failed outpatient treatment. She finished the course of Zithromax a week ago. -CT chest confirms above -pulmonary consultation. Patient does not have a primary care and with her history of tobacco abuse will have her follow-up by pulmonology both in-house and outpatient -IV Rocephin and doxycycline -oral Mucinex BID -mucomyst nebulizer BID  2. hypertension untreated -start amlodipine  3. Tobacco  abuse smoking cessation counsel. Patient agrees on smoking cessation she is using Nicorette gum  4. History of alcohol use. Patient drinks for vodka/gin about 3-4 drinks.  5.DVT prophylaxis lovenox     All the records are reviewed and case discussed with Care Management/Social Workerr. Management plans discussed with the patient, family and they are in agreement.  CODE STATUS: Full.  TOTAL TIME TAKING CARE OF THIS PATIENT: 35 minutes.     POSSIBLE D/C IN 1-2 DAYS, DEPENDING ON CLINICAL CONDITION.   Altamese Dilling M.D on 11/23/2017   Between 7am to 6pm - Pager - [phone]  After 6pm go to [email or web address] - password [email or web address]  [facility]  Office  [phone]  CC: Primary care physician; Patient, No Pcp Per  Note: This dictation was prepared with Dragon dictation along with smaller phrase technology. Any transcriptional errors that result from this process are unintentional.

## 2017-11-23 NOTE — Progress Notes (Signed)
   11/23/17 1000  Clinical Encounter Type  Visited With Patient not available  Visit Type Initial;Spiritual support  Referral From Nurse  Consult/Referral To Chaplain  Spiritual Encounters  Spiritual Needs Other (Comment)   CH received an OR to educate the patient on the AD process. When I entered the room the patient was sleeping well and I did not disturb her. I will check back later on today.

## 2017-11-23 NOTE — Progress Notes (Signed)
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  GOAL:   Patient will meet greater than or equal to 90% of their needs  MONITOR:   PO intake, Supplement acceptance, Labs, Weight trends, Skin, I & O's  REASON FOR ASSESSMENT:   Malnutrition Screening Tool    ASSESSMENT:   77y.o. female with a known history of hypertension, asthma not on any medication, tobacco abuse and heavy etoh use admitted with PNA    Met with pt in room today. Pt reports poor appetite and oral intake at baseline; reports that she gets full really quickly. Pt does like Ensure and buys the drug store brand at home. Pt reports that she does not drink these regularly. Per chart, pt with 9lb(7%) wt loss over the past month which is significant given the time frame. Pt currently eating 75% of meals in hospital. RD will add supplements and MVI to help pt meet her estimated needs. Will add thiamine and folic acid in the setting of heavy etoh use. Will also add vitamin C as pt with ecchymosis and reports heavy smoking and chronic poor appetite,   Medications reviewed and include: doxycycline, lovenox, nicotine, ceftriaxone   Labs reviewed: creat 1.16(H)  NUTRITION - FOCUSED PHYSICAL EXAM:    Most Recent Value  Orbital Region  Moderate depletion  Upper Arm Region  Severe depletion  Thoracic and Lumbar Region  Severe depletion  Buccal Region  Moderate depletion  Temple Region  Moderate depletion  Clavicle Bone Region  Severe depletion   Clavicle and Acromion Bone Region  Severe depletion  Scapular Bone Region  Moderate depletion  Dorsal Hand  Severe depletion  Patellar Region  Severe depletion  Anterior Thigh Region  Moderate depletion  Posterior Calf Region  Moderate depletion  Edema (RD Assessment)  Mild  Hair  Reviewed  Eyes  Reviewed  Mouth  Reviewed  Skin  Reviewed [[email or web address]]  Nails  Reviewed     Diet Order:   Diet Order            Diet regular Room service appropriate? Yes; Fluid consistency: Thin  Diet effective now             EDUCATION NEEDS:   Education needs have been addressed  Skin:  Skin Assessment: Reviewed RN Assessment(Ecchymosis )  Last BM:  9/30  Height:   Ht Readings from Last 1 Encounters:  11/22/17 5' 9"  (1.753 m)    Weight:   Wt Readings from Last 1 Encounters:  11/22/17 54.8 kg    Ideal Body Weight:  65.9 kg  BMI:  Body mass index is 17.85 kg/m.  Estimated Nutritional Needs:   Kcal:  1500-1800kcal/day   Protein:  83-93g/day   Fluid:  >1.4L/day   CKoleen DistanceMS, RD, LDN Pager #- [phone]#- [phone] Hours Pager: [phone]

## 2017-11-23 NOTE — Progress Notes (Signed)
   11/23/17 1245  Clinical Encounter Type  Visited With Patient and family together  Visit Type Initial;Spiritual support  Referral From Nurse  Consult/Referral To Chaplain  Spiritual Encounters  Spiritual Needs Brochure;Emotional   CH received an OR to educate the patient on the AD process. Amber Vasquez was eating her luch when I entered her room and being visited by her sisters. I explained the AD process and left the paperwork with the patient to be completed if desired

## 2017-11-24 DIAGNOSIS — E43 Unspecified severe protein-calorie malnutrition: Secondary | ICD-10-CM

## 2017-11-24 DIAGNOSIS — N179 Acute kidney failure, unspecified: Secondary | ICD-10-CM | POA: Diagnosis not present

## 2017-11-24 DIAGNOSIS — J189 Pneumonia, unspecified organism: Secondary | ICD-10-CM | POA: Diagnosis not present

## 2017-11-24 LAB — BASIC METABOLIC PANEL
Anion gap: 5 (ref 5–15)
BUN: 11 mg/dL (ref 8–23)
CALCIUM: 8.6 mg/dL — AB (ref 8.9–10.3)
CO2: 25 mmol/L (ref 22–32)
Chloride: 110 mmol/L (ref 98–111)
Creatinine, Ser: 0.8 mg/dL (ref 0.44–1.00)
GFR calc Af Amer: >60 mL/min (ref >60)
Glucose, Bld: 103 mg/dL — ABNORMAL HIGH (ref 70–99)
Potassium: 3.8 mmol/L (ref 3.5–5.1)
Sodium: 140 mmol/L (ref 135–145)

## 2017-11-24 MED ORDER — NICOTINE 21 MG/24HR TD PT24: 21.0000 mg | MEDICATED_PATCH | Freq: Every day | TRANSDERMAL | 0 refills | Status: DC

## 2017-11-24 MED ORDER — DOXYCYCLINE HYCLATE 100 MG PO TABS: 100.0000 mg | ORAL_TABLET | Freq: Two times a day (BID) | ORAL | 0 refills | Status: AC

## 2017-11-24 MED ORDER — CEFUROXIME AXETIL 250 MG PO TABS: 250.0000 mg | ORAL_TABLET | Freq: Two times a day (BID) | ORAL | 0 refills | Status: AC

## 2017-11-24 MED ORDER — AMLODIPINE BESYLATE 10 MG PO TABS: 10.0000 mg | ORAL_TABLET | Freq: Every day | ORAL | 0 refills | Status: DC

## 2017-11-24 NOTE — Discharge Summary (Signed)
[facility] - [place] at [facility]   PATIENT NAME: Amber Vasquez    MR#:  [id number]  DATE OF BIRTH:  29-Dec-1940  DATE OF ADMISSION:  11/22/2017 ADMITTING PHYSICIAN: Enedina Finner, MD  DATE OF DISCHARGE: 11/24/2017   PRIMARY CARE PHYSICIAN: Patient, No Pcp Per    ADMISSION DIAGNOSIS:  AKI (acute kidney injury) (HCC) [N17.9] Community acquired pneumonia of right middle lobe of lung (HCC) [J18.1]  DISCHARGE DIAGNOSIS:  Active Problems:   Pneumonia   Protein-calorie malnutrition, severe   SECONDARY DIAGNOSIS:   Past Medical History:  Diagnosis Date  . Asthma   . Hypertension     HOSPITAL COURSE:   CoraMcCoyis a77 y.o.femalewith a known history of pretension, asthma not on any medication, tobacco abuse comes to the emergency room with increasing cough not feeling well and weakness with dizziness. Workup in the ER showed patient has right middle lobe pneumonia with mucusimpaction-multifocal. Patient just finished the course of azithromycin for five days. She did it about a week ago.  1.right middle lobe pneumonia with mucus and impaction multifocal -failed outpatient treatment. She finished the course of Zithromax a week ago. -CT chest confirms above -pulmonary consultation. Patient does not have a primary care and with her history of tobacco abuse will have her follow-up by pulmonology both in-house and outpatient -IV Rocephin and doxycycline -mucomystnebulizer BID - Pt felt much better next day- d/c on oral doxy + Cefuroxime. - Follow in pulm clinic.  2.hypertension untreated -start amlodipine  3.Tobacco abuse smoking cessation counsel. Patient agrees on smoking cessation she is using Nicorette gum  4.History of alcohol use. Patient drinks for vodka/gin about 3-4 drinks.  5.DVT prophylaxis lovenox  DISCHARGE CONDITIONS:   Stable.  CONSULTS OBTAINED:  Treatment Team:  Salena Saner, MD  DRUG ALLERGIES:  No Known  Allergies  DISCHARGE MEDICATIONS:   Allergies as of 11/24/2017   No Known Allergies     Medication List    STOP taking these medications   hydrochlorothiazide 25 MG tablet Commonly known as:  HYDRODIURIL     TAKE these medications   albuterol 108 (90 Base) MCG/ACT inhaler Commonly known as:  PROVENTIL HFA;VENTOLIN HFA Inhale 2 puffs into the lungs every 6 (six) hours as needed for wheezing or shortness of breath.   amLODipine 10 MG tablet Commonly known as:  NORVASC Take 1 tablet (10 mg total) by mouth daily.   cefUROXime 250 MG tablet Commonly known as:  CEFTIN Take 1 tablet (250 mg total) by mouth 2 (two) times daily for 4 days.   doxycycline 100 MG tablet Commonly known as:  VIBRA-TABS Take 1 tablet (100 mg total) by mouth every 12 (twelve) hours for 4 days.   nicotine 21 mg/24hr patch Commonly known as:  NICODERM CQ - dosed in mg/24 hours Place 1 patch (21 mg total) onto the skin daily.        DISCHARGE INSTRUCTIONS:    Follow with PMD in 1-2 weeks.  If you experience worsening of your admission symptoms, develop shortness of breath, life threatening emergency, suicidal or homicidal thoughts you must seek medical attention immediately by calling 911 or calling your MD immediately  if symptoms less severe.  You Must read complete instructions/literature along with all the possible adverse reactions/side effects for all the Medicines you take and that have been prescribed to you. Take any new Medicines after you have completely understood and accept all the possible adverse reactions/side effects.   Please note  You were cared  for by a hospitalist during your hospital stay. If you have any questions about your discharge medications or the care you received while you were in the hospital after you are discharged, you can call the unit and asked to speak with the hospitalist on call if the hospitalist that took care of you is not available. Once you are discharged,  your primary care physician will handle any further medical issues. Please note that NO REFILLS for any discharge medications will be authorized once you are discharged, as it is imperative that you return to your primary care physician (or establish a relationship with a primary care physician if you do not have one) for your aftercare needs so that they can reassess your need for medications and monitor your lab values.    Today   CHIEF COMPLAINT:   Chief Complaint  Patient presents with  . Cough    HISTORY OF PRESENT ILLNESS:  Amber Vasquez  is a 77 y.o. female with a known history of pretension, asthma not on any medication, tobacco abuse comes to the emergency room with increasing cough not feeling well and weakness with dizziness. Workup in the ER showed patient has right middle lobe pneumonia with mucus impaction-multifocal. Patient just finished the course of azithromycin for five days. She did it about a week ago.  Denies any fever any nausea vomiting. It is being admitted for right middle lobe pneumonia.  She tells me she takes three or four drinks of gin every day in the evening before going to bed time she does not have any alcohol withdrawal history or alcohol-related health issues.   VITAL SIGNS:  Blood pressure (!) 157/73, pulse 83, temperature 98 F (36.7 C), temperature source Oral, resp. rate 18, height 5\' 9"  (1.753 m), weight 55.3 kg, SpO2 98 %.  I/O:    Intake/Output Summary (Last 24 hours) at 11/24/2017 1101 Last data filed at 11/23/2017 2122 Gross per 24 hour  Intake 247.25 ml  Output -  Net 247.25 ml    PHYSICAL EXAMINATION:  GENERAL:  77 y.o.-year-old patient lying in the bed with no acute distress.  EYES: Pupils equal, round, reactive to light and accommodation. No scleral icterus. Extraocular muscles intact.  HEENT: Head atraumatic, normocephalic. Oropharynx and nasopharynx clear.  NECK:  Supple, no jugular venous distention. No thyroid enlargement,  no tenderness.  LUNGS: Normal breath sounds bilaterally, no wheezing, rales,rhonchi or crepitation. No use of accessory muscles of respiration.  CARDIOVASCULAR: S1, S2 normal. No murmurs, rubs, or gallops.  ABDOMEN: Soft, non-tender, non-distended. Bowel sounds present. No organomegaly or mass.  EXTREMITIES: No pedal edema, cyanosis, or clubbing.  NEUROLOGIC: Cranial nerves II through XII are intact. Muscle strength 5/5 in all extremities. Sensation intact. Gait not checked.  PSYCHIATRIC: The patient is alert and oriented x 3.  SKIN: No obvious rash, lesion, or ulcer.   DATA REVIEW:   CBC Recent Labs  Lab 11/22/17 1416  WBC 9.4  HGB 11.3*  HCT 34.0*  PLT 237    Chemistries  Recent Labs  Lab 11/22/17 1416 11/24/17 0427  NA 142 140  K 4.0 3.8  CL 106 110  CO2 24 25  GLUCOSE 106* 103*  BUN 20 11  CREATININE 1.16* 0.80  CALCIUM 9.2 8.6*  AST 19  --   ALT 7  --   ALKPHOS 81  --   BILITOT 0.7  --     Cardiac Enzymes No results for input(s): TROPONINI in the last 168 hours.  Microbiology Results  No results found for this or any previous visit.  RADIOLOGY:  Dg Chest 2 View  Result Date: 11/22/2017 CLINICAL DATA:  Productive cough for 2 weeks.  Smoker EXAM: CHEST - 2 VIEW COMPARISON:  07/27/2016 FINDINGS: Airspace disease in the right middle lobe, best seen on the lateral view. Large lung volumes. There is no edema, effusion, or pneumothorax. Borderline heart size, stable. IMPRESSION: 1. Presumed right middle lobe pneumonia. Followup PA and lateral chest X-ray is recommended in 3-4 weeks following trial of antibiotic therapy to ensure resolution. 2. Large lung volumes, suspect COPD. Electronically Signed   By: Marnee Spring M.D.   On: 11/22/2017 13:43   Ct Chest W Contrast  Result Date: 11/22/2017 CLINICAL DATA:  Cough and fever with pneumonia by radiography EXAM: CT CHEST WITH CONTRAST TECHNIQUE: Multidetector CT imaging of the chest was performed during intravenous  contrast administration. CONTRAST:  75mL OMNIPAQUE IOHEXOL 300 MG/ML  SOLN COMPARISON:  Chest radiograph from earlier today FINDINGS: Cardiovascular: Normal heart size. No pericardial effusion. Extensive coronary atherosclerotic calcification. There is aortic wall atherosclerotic calcification. Mediastinum/Nodes: Negative for adenopathy. Lungs/Pleura: There is generalized airway thickening with patchy mucoid impaction in the lower lobes where there is mild atelectasis. Airspace disease in the right middle lobe along the medial segment bronchovascular tree. No masslike areas, effusion, or cavitation. Upper Abdomen: Partially covered left renal cystic density. Musculoskeletal: Posterior right twelfth rib fracture with callus consistent with healing. No acute or aggressive finding IMPRESSION: 1. Confirmed right middle lobe pneumonia. There is a background of bronchitis and multifocal impaction mucoid impaction. Followup PA and lateral chest X-ray is recommended in 3-4 weeks to ensure resolution. 2. Extensive coronary atherosclerosis. 3. Healing right twelfth rib fracture. Electronically Signed   By: Marnee Spring M.D.   On: 11/22/2017 16:24    EKG:   Orders placed or performed during the hospital encounter of 11/22/17  . ED EKG  . ED EKG      Management plans discussed with the patient, family and they are in agreement.  CODE STATUS:     Code Status Orders  (From admission, onward)         Start     Ordered   11/22/17 1841  Full code  Continuous     11/22/17 1840        Code Status History    This patient has a current code status but no historical code status.      TOTAL TIME TAKING CARE OF THIS PATIENT: 35 minutes.    Altamese Dilling M.D on 11/24/2017 at 11:01 AM  Between 7am to 6pm - Pager - [phone]  After 6pm go to [email or web address] - password [email or web address]  [facility]  Office  [phone]  CC: Primary care physician; Patient, No Pcp Per   Note:  This dictation was prepared with Dragon dictation along with smaller phrase technology. Any transcriptional errors that result from this process are unintentional.

## 2017-11-24 NOTE — Care Management Note (Signed)
Case Management Note  Patient Details  Name: Amber Vasquez MRN: [id number] Date of Birth: 1940/03/15  Subjective/Objective:    Admitted to [facility] with the diagnosis of pneumonia. Lives alone. No primary care physician. Private insurance per [employer].  States she hasn't seen a physician in years.  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 Elisabeth Pigeon                Action/Plan: Information on physicians accepting new patients given Dr. Phill Myron Office number given   Expected Discharge Date:  11/24/17               Expected Discharge Plan:     In-House Referral:   yes  Discharge planning Services   yes  Post Acute Care Choice:   yes Choice offered to:   patient  DME Arranged:    DME Agency:     HH Arranged:    HH Agency:     Status of Service:     If discussed at [employer], dates discussed:    Additional Comments:  Gwenette Greet, RN MSN CCM Care Management [phone] 11/24/2017, 10:36 AM

## 2017-11-24 NOTE — Progress Notes (Signed)
Pt D/C to home by self.  VSS. IV removed intact. Education completed. All questions answered. Subscriptions handed to pt.

## 2017-11-24 NOTE — Progress Notes (Signed)
[facility]         [place], [place].   11/24/2017  Patient: Amber Vasquez   Date of Birth:  1940/09/07  Date of admission:  11/22/2017  Date of Discharge  11/24/2017    To Whom it May Concern:   Lynna Zamorano  may return to work on 11/25/17.  PHYSICAL ACTIVITY:  Full  If you have any questions or concerns, please don't hesitate to call.  Sincerely,   Altamese Dilling M.D Pager [phone] Office : [phone]   .

## 2017-12-08 ENCOUNTER — Other Ambulatory Visit: Payer: Self-pay | Admitting: *Deleted

## 2017-12-08 ENCOUNTER — Encounter: Payer: Self-pay | Admitting: Pulmonary Disease

## 2017-12-08 ENCOUNTER — Ambulatory Visit (INDEPENDENT_AMBULATORY_CARE_PROVIDER_SITE_OTHER): Payer: Medicare PPO | Admitting: Pulmonary Disease

## 2017-12-08 VITALS — BP 144/68 | HR 81 | Ht 67.5 in | Wt 126.4 lb

## 2017-12-08 DIAGNOSIS — F17201 Nicotine dependence, unspecified, in remission: Secondary | ICD-10-CM

## 2017-12-08 DIAGNOSIS — J449 Chronic obstructive pulmonary disease, unspecified: Secondary | ICD-10-CM | POA: Diagnosis not present

## 2017-12-08 DIAGNOSIS — J181 Lobar pneumonia, unspecified organism: Secondary | ICD-10-CM | POA: Diagnosis not present

## 2017-12-08 DIAGNOSIS — Z2821 Immunization not carried out because of patient refusal: Secondary | ICD-10-CM | POA: Diagnosis not present

## 2017-12-08 MED ORDER — FLUTICASONE-UMECLIDIN-VILANT 100-62.5-25 MCG/INH IN AEPB: 1.0000 | INHALATION_SPRAY | Freq: Every day | RESPIRATORY_TRACT | 0 refills | Status: AC

## 2017-12-08 NOTE — Patient Instructions (Signed)
  1) You do have COPD however it appears to be mild at present. We congratulate you on having quit smoking.  2) We will start you on Trelegy Ellipta one puff daily.  3) We will see you in 3 to 4 weeks time at that time you will have a chest x-ray to follow up on the pneumonia that you had while you were in the hospital.  4) You declined the flu vaccine today.   5) Continue to use your flutter valve ("the pickle") you may use Mucinex twice a day.

## 2017-12-08 NOTE — Progress Notes (Signed)
Subjective:    Patient ID: Amber Vasquez, female    DOB: 01-28-1941, 77 y.o.   MRN: [id number]  HPI Latrish Mogel is a 77 year old recent former smoker, who was admitted to [facility] on 30 September with right middle lobe pneumonia and a COPD exacerbation. She presents today for follow-up after that hospitalization. We saw the patient in consultation on 23 November 2017. Please refer to the consult note for the details. The patient was treated for lobar pneumonia, no organism specified, and for COPD exacerbation with bronchodilators and steroids. She was discharged on 2 October with albuterol as needed and as steroid taper on antibiotics. She was unable to get the albuterol filled due to cost. She did get prednisone filled and has completed it. Her sputum has, less tenacious but still continues to have some difficulty expectorating. She has noted some wheezing. She has been abstinent of cigarettes since she left the hospital. She notes that she has been compliant with use of the acappella flutter valve and this helps her sputum production. She voices no other complaint today.   Review of Systems  Constitutional: Positive for fatigue.  HENT: Negative.   Eyes: Negative.   Respiratory: Positive for cough, chest tightness, shortness of breath and wheezing.   Cardiovascular: Negative.   Gastrointestinal: Negative.   All other systems reviewed and are negative.      Objective:   Physical Exam  Constitutional: She is oriented to person, place, and time. She appears well-developed.  Thin.  HENT:  Head: Normocephalic and atraumatic.  Mouth/Throat: Oropharynx is clear and moist. No oropharyngeal exudate.  Poor dentition.  Eyes: Pupils are equal, round, and reactive to light. Conjunctivae are normal. No scleral icterus.  Neck: Neck supple. No JVD present. No tracheal deviation present. No thyromegaly present.  Cardiovascular: Normal rate, regular rhythm, normal heart sounds and intact distal pulses.    Pulmonary/Chest: Effort normal. No stridor. No respiratory distress. She has wheezes. She has no rales. She exhibits no tenderness.  Rhonchi noted throughout.  Abdominal: Soft. Bowel sounds are normal. She exhibits no distension.  Musculoskeletal: She exhibits no edema or deformity.  Lymphadenopathy:    She has no cervical adenopathy.  Neurological: She is alert and oriented to person, place, and time.  No focal deficits  Skin: Skin is warm and dry. No rash noted. No erythema. No pallor.  Psychiatric: She has a normal mood and affect. Thought content normal.  Nursing note and vitals reviewed.   Spirometry was performed today she has mild airway obstruction noted.     Assessment & Plan:   1) Chronic obstructive pulmonary disease with recent acute exacerbation, she is improved but still poorly compensated. She is not on any bronchodilators. She has difficulties due to financial issues for this reason will provide her with two samples (total of one month supply) of Trelegy Ellipta, one puff daily. She was shown the proper use of the dry powder inhaler. She was also instructed to rinse well after use. She is to continue Acappella flutter valve or pulmonary toilet. She was provided with samples of Mucinex to use 600 mg twice a day as needed for tenacious secretions. She was also instructed to keep up with fluids during the day.  2) The patient had right middle lobe pneumonia no organism specified. She has completed antibiotic therapy. We will repeat chest x-ray upon return visit which will be in 3 to 4 weeks time.  3) Tobacco abuse in remission, she was commended for her ongoing  abstinence from tobacco.  4) Patient refused influenza vaccine. Will not cite specific reason.

## 2018-01-03 ENCOUNTER — Ambulatory Visit
Admission: RE | Admit: 2018-01-03 | Discharge: 2018-01-03 | Disposition: A | Discharge status: Home / Self Care | Payer: Medicare PPO | Source: Ambulatory Visit | Attending: Pulmonary Disease | Admitting: Pulmonary Disease

## 2018-01-03 ENCOUNTER — Ambulatory Visit (INDEPENDENT_AMBULATORY_CARE_PROVIDER_SITE_OTHER): Payer: Medicare PPO | Admitting: Pulmonary Disease

## 2018-01-03 ENCOUNTER — Encounter: Payer: Self-pay | Admitting: *Deleted

## 2018-01-03 ENCOUNTER — Encounter: Payer: Self-pay | Admitting: Pulmonary Disease

## 2018-01-03 VITALS — BP 142/68 | HR 84 | Ht 67.0 in | Wt 125.6 lb

## 2018-01-03 DIAGNOSIS — I10 Essential (primary) hypertension: Secondary | ICD-10-CM

## 2018-01-03 DIAGNOSIS — J181 Lobar pneumonia, unspecified organism: Secondary | ICD-10-CM | POA: Insufficient documentation

## 2018-01-03 DIAGNOSIS — J449 Chronic obstructive pulmonary disease, unspecified: Secondary | ICD-10-CM | POA: Diagnosis not present

## 2018-01-03 MED ORDER — FELODIPINE ER 10 MG PO TB24: 10.0000 mg | ORAL_TABLET | Freq: Every day | ORAL | 1 refills | Status: DC

## 2018-01-03 NOTE — Patient Instructions (Signed)
1) You will be referred to a primary care doctor.  2) W wrote a prescription for your blood pressure medicine for one month until you get to see her primary care doctor. After you have a primary care doctor they will need to refill this medication for you. The medication has been changed to one that your insurance plan covers.  3) We will see you in follow-up and three months time.  4) Your x-ray today looks better. If for some reason further imaging needs to be done we will schedule that prior to your follow-up.

## 2018-01-03 NOTE — Progress Notes (Signed)
Subjective:    Patient ID: Amber Vasquez, female    DOB: 1940-07-16, 77 y.o.   MRN: [id number]  HPI Amber Vasquez is a 77 year old recent former smoker (September 2019), who was admitted to [facility] on 30 September with right middle lobe pneumonia and a COPD exacerbation. She presents today for follow-up after that hospitalization. We saw the patient in consultation on 23 November 2017. Please refer to the consult note for the details. The patient was treated for lobar pneumonia, no organism specified, and for COPD exacerbation with bronchodilators and steroids. She was discharged on 2 October with albuterol as needed and as steroid taper on antibioticsShe has been abstinent of cigarettes since she left the hospital. She notes that she has been compliant with use of the acappella flutter valve and this helps her sputum clearance. We gave her a trial of Trelegy Ellipta during her October visit, she is this inhaler and has now quit using it. States that she doesn't like to take medications. She has rescue albuterol available but has not needed it. She feels markedly better. She continues to remain abstinent of cigarettes.   Her only issues today are that she needs her blood pressure medication refilled. She does not have a primary care physician. She states that she feels better when she takes her blood pressure medicine. She went to the ambulatory care service at [facility] where she works, they gave her a three day supply of amlodipine 10 mg which her insurance does not cover. It is the medication that she was given during her hospitalization.  Other than this she has no other complaints.   Review of Systems  Constitutional: Negative.   HENT: Negative.   Eyes: Negative.   Respiratory: Negative.   Cardiovascular: Negative.   Gastrointestinal: Negative.   Musculoskeletal: Negative.   Skin: Negative.   Neurological: Negative.   All other systems reviewed and are negative.      Objective:   Physical Exam  Constitutional: She is oriented to person, place, and time. She appears well-developed and well-nourished. No distress.  HENT:  Head: Normocephalic and atraumatic.  Nose: Nose normal.  Mouth/Throat: Oropharynx is clear and moist.  Eyes: Pupils are equal, round, and reactive to light. Conjunctivae and EOM are normal. No scleral icterus.  Neck: Neck supple. No JVD present.  Cardiovascular: Normal rate, regular rhythm, normal heart sounds and intact distal pulses.  No murmur heard. Pulmonary/Chest: Effort normal and breath sounds normal. No respiratory distress.  Abdominal: Soft. She exhibits no distension.  Musculoskeletal: Normal range of motion. She exhibits no edema.  Neurological: She is alert and oriented to person, place, and time.  No focal deficits.  Skin: Skin is warm and dry.  Psychiatric: She has a normal mood and affect. Her behavior is normal. Judgment normal.  Nursing note and vitals reviewed.   She had a chest x-ray today which showed improvement on her previously noted right middle lobe infiltrate. Radiologist interpretations still pending.      Assessment & Plan:  1) Chronic obstructive pulmonary disease: she is improved she is compensated. She is not on any bronchodilators. She used Trelegy Ellipta for a brief. But now has stopped using this medication citing that she does not like to use medicines.She is to continue Acappella flutter valve or pulmonary toilet, she is actually compliant with this and states that this helps her the most. She does have albuterol rescue inhaler but has not required to use it. Follow-up will be in three months  time she is to contact us prior to that time should any new difficulties arise.  2) Right middle lobe pneumonia, no organism specified, resolved: chest x-ray today shows improvement. Await radiologist formal interpretation.  3) Essential hypertension: the patient does not have primary physician as of yet. She has been  referred to a primary care physician. She has a limited supply of amlodipine. Her insurance plan does not cover amlodipine but does cover felodipine. A prescription for Felodipine 10 mg daily was written to last one month. She was advised to get a primary care physician as we will not continue to refill this medication.  4) Tobacco abuse in remission: she was commended for her ongoing abstinence from tobacco.  5) Patient continues to refuse influenza vaccine. Will not cite specific reason.

## 2018-03-22 ENCOUNTER — Encounter: Payer: Self-pay | Admitting: Intensive Care

## 2018-03-22 ENCOUNTER — Emergency Department
Admission: EM | Admit: 2018-03-22 | Discharge: 2018-03-22 | Disposition: A | Discharge status: Home / Self Care | Payer: Medicare PPO | Attending: Emergency Medicine | Admitting: Emergency Medicine

## 2018-03-22 ENCOUNTER — Emergency Department: Payer: Medicare PPO

## 2018-03-22 ENCOUNTER — Other Ambulatory Visit: Payer: Self-pay

## 2018-03-22 DIAGNOSIS — M545 Low back pain: Secondary | ICD-10-CM | POA: Diagnosis present

## 2018-03-22 DIAGNOSIS — I10 Essential (primary) hypertension: Secondary | ICD-10-CM

## 2018-03-22 DIAGNOSIS — J45909 Unspecified asthma, uncomplicated: Secondary | ICD-10-CM | POA: Insufficient documentation

## 2018-03-22 DIAGNOSIS — M5440 Lumbago with sciatica, unspecified side: Secondary | ICD-10-CM

## 2018-03-22 DIAGNOSIS — Z87891 Personal history of nicotine dependence: Secondary | ICD-10-CM | POA: Diagnosis not present

## 2018-03-22 DIAGNOSIS — Z79899 Other long term (current) drug therapy: Secondary | ICD-10-CM | POA: Insufficient documentation

## 2018-03-22 DIAGNOSIS — D649 Anemia, unspecified: Secondary | ICD-10-CM

## 2018-03-22 DIAGNOSIS — M5430 Sciatica, unspecified side: Secondary | ICD-10-CM | POA: Diagnosis not present

## 2018-03-22 LAB — BASIC METABOLIC PANEL
Anion gap: 6 (ref 5–15)
BUN: 15 mg/dL (ref 8–23)
CHLORIDE: 111 mmol/L (ref 98–111)
CO2: 24 mmol/L (ref 22–32)
CREATININE: 1.04 mg/dL — AB (ref 0.44–1.00)
Calcium: 8.8 mg/dL — ABNORMAL LOW (ref 8.9–10.3)
GFR calc Af Amer: >60 mL/min (ref >60)
GFR calc non Af Amer: 52 mL/min — ABNORMAL LOW (ref >60)
Glucose, Bld: 93 mg/dL (ref 70–99)
POTASSIUM: 4.1 mmol/L (ref 3.5–5.1)
SODIUM: 141 mmol/L (ref 135–145)

## 2018-03-22 LAB — CBC WITH DIFFERENTIAL/PLATELET
ABS IMMATURE GRANULOCYTES: 0.02 10*3/uL (ref 0.00–0.07)
Basophils Absolute: 0 10*3/uL (ref 0.0–0.1)
Basophils Relative: 0 %
EOS PCT: 3 %
Eosinophils Absolute: 0.2 10*3/uL (ref 0.0–0.5)
HEMATOCRIT: 30.2 % — AB (ref 36.0–46.0)
HEMOGLOBIN: 9.5 g/dL — AB (ref 12.0–15.0)
Immature Granulocytes: 0 %
LYMPHS ABS: 1.5 10*3/uL (ref 0.7–4.0)
LYMPHS PCT: 22 %
MCH: 26.2 pg (ref 26.0–34.0)
MCHC: 31.5 g/dL (ref 30.0–36.0)
MCV: 83.4 fL (ref 80.0–100.0)
MONO ABS: 0.5 10*3/uL (ref 0.1–1.0)
MONOS PCT: 8 %
NEUTROS ABS: 4.6 10*3/uL (ref 1.7–7.7)
NRBC: 0 % (ref 0.0–0.2)
Neutrophils Relative %: 67 %
Platelets: 205 10*3/uL (ref 150–400)
RBC: 3.62 MIL/uL — AB (ref 3.87–5.11)
RDW: 15.1 % (ref 11.5–15.5)
WBC: 6.9 10*3/uL (ref 4.0–10.5)

## 2018-03-22 MED ORDER — KETOROLAC TROMETHAMINE 30 MG/ML IJ SOLN
30.0000 mg | Freq: Once | INTRAMUSCULAR | Status: AC
  Administered 2018-03-22: 30 mg via INTRAMUSCULAR
  Filled 2018-03-22: qty 1

## 2018-03-22 MED ORDER — METHYLPREDNISOLONE 4 MG PO TBPK: ORAL_TABLET | ORAL | 0 refills | Status: DC

## 2018-03-22 MED ORDER — FERROUS SULFATE 325 (65 FE) MG PO TABS: 325.0000 mg | ORAL_TABLET | Freq: Every day | ORAL | 0 refills | Status: AC

## 2018-03-22 MED ORDER — PREDNISONE 20 MG PO TABS
60.0000 mg | ORAL_TABLET | Freq: Once | ORAL | Status: AC
  Administered 2018-03-22: 60 mg via ORAL
  Filled 2018-03-22: qty 3

## 2018-03-22 MED ORDER — AMLODIPINE BESYLATE 10 MG PO TABS: 10.0000 mg | ORAL_TABLET | Freq: Every day | ORAL | 0 refills | Status: AC

## 2018-03-22 MED ORDER — AMLODIPINE BESYLATE 5 MG PO TABS
10.0000 mg | ORAL_TABLET | Freq: Once | ORAL | Status: AC
  Administered 2018-03-22: 10 mg via ORAL
  Filled 2018-03-22: qty 2

## 2018-03-22 NOTE — ED Provider Notes (Signed)
[facility] Emergency Department Provider Note  ____________________________________________   First MD Initiated Contact with Patient 03/22/18 1103     (approximate)  I have reviewed the triage vital signs and the nursing notes.   HISTORY  Chief Complaint Fall and Back Pain   HPI Amber Vasquez is a 78 y.o. female with a history of asthma as well as hypertension was presented to the emergency department with right-sided back pain over the past 5 days.  She says that this past Thursday at work she was moving a bench and thought that she pulled something in her right lower back.  However, since then she has had persistent pain that is now started to radiate down the back of her right leg.  She says that she is having difficulty walking and walks around with a chair to brace herself because of the pain to her right lower extremity.  She denies any bowel or bladder incontinence that is different from her baseline.  Says that she has a baseline incontinence of urine.  Denies any numbness in her bilateral lower lower extremities.  However, she does state that she does have a "stinging" in her legs that is been ongoing for over a year.  States that she has had a similar issue in the past with back pain where she required steroids. Patient also states that she has been off her blood pressure medications for approximately 2 weeks now and does not have a primary care doctor appointment until March 9.  States that she is attributing her "swimmy headedness" to high blood pressure.  Says that she gets mild lightheadedness when standing up quickly.  Denies any black or bloody stools.   Past Medical History:  Diagnosis Date  . Asthma   . Hypertension     Patient Active Problem List   Diagnosis Date Noted  . Protein-calorie malnutrition, severe 11/24/2017  . Pneumonia 11/22/2017    Past Surgical History:  Procedure Laterality Date  . ABDOMINAL HYSTERECTOMY      Prior to  Admission medications   Medication Sig Start Date End Date Taking? Authorizing Provider  amLODipine (NORVASC) 10 MG tablet Take 1 tablet (10 mg total) by mouth daily. 11/24/17  Yes Altamese DillingVachhani, Vaibhavkumar, MD  Ascorbic Acid (VITAMIN C) 1000 MG tablet Take 1,000 mg by mouth daily.   Yes [provider]  Cyanocobalamin (B-12) 5000 MCG CAPS Take by mouth.   Yes [provider]  Fluticasone-Umeclidin-Vilant (TRELEGY ELLIPTA) 100-62.5-25 MCG/INH AEPB Inhale 1 puff into the lungs daily. 12/08/17  Yes Salena SanerGonzalez, Carmen L, MD    Allergies Patient has no known allergies.  Family History  Problem Relation Age of Onset  . Hypertension Mother     Social History Social History   Tobacco Use  . Smoking status: Former Smoker    Packs/day: 0.50    Types: Cigarettes    Last attempt to quit: 11/20/2017    Years since quitting: 0.3  . Smokeless tobacco: Never Used  Substance Use Topics  . Alcohol use: Yes    Alcohol/week: 14.0 standard drinks    Types: 14 Shots of liquor per week  . Drug use: Never    Review of Systems  Constitutional: No fever/chills Eyes: No visual changes. ENT: No sore throat. Cardiovascular: Denies chest pain. Respiratory: Denies shortness of breath. Gastrointestinal: No abdominal pain.  No nausea, no vomiting.  No diarrhea.  No constipation. Genitourinary: Negative for dysuria. Musculoskeletal: As above Skin: Negative for rash. Neurological: As above  ____________________________________________   PHYSICAL EXAM:  VITAL SIGNS: ED Triage Vitals  Enc Vitals Group     BP 03/22/18 0934 (!) 154/68     Pulse Rate 03/22/18 0934 80     Resp 03/22/18 0934 16     Temp 03/22/18 0934 98.3 F (36.8 C)     Temp Source 03/22/18 0934 Oral     SpO2 03/22/18 0934 100 %     Weight 03/22/18 0936 135 lb (61.2 kg)     Height 03/22/18 0936 5\' 8"  (1.727 m)     Head Circumference --      Peak Flow --      Pain Score 03/22/18 0934 8     Pain Loc --      Pain  Edu? --      Excl. in GC? --     Constitutional: Alert and oriented. Well appearing and in no acute distress. Eyes: Conjunctivae are normal.  Head: Atraumatic. Nose: No congestion/rhinnorhea. Mouth/Throat: Mucous membranes are moist.  Neck: No stridor.   Cardiovascular: Normal rate, regular rhythm. Grossly normal heart sounds.  Good peripheral circulation with equal and bilateral dorsalis pedis pulses. Respiratory: Normal respiratory effort.  No retractions. Lungs CTAB. Gastrointestinal: Soft and nontender. No distention.  Digital rectal exam with brown stool which is heme-negative. Musculoskeletal: Tenderness to palpation laterally over the right lower lumbar region.  No ecchymosis.  No crepitus or swelling noted.  No midline tenderness or step-off to the thoracic or lumbar spines.  Negative straight leg raise on the right.  Patient does have a pain that radiates down to the mid thigh but does not cross the knee.  No saddle anesthesia.  Normal neurologic exam to the left lower extremity.  Patient able to ambulate with a limp secondary to pain on the right right side.   Neurologic:  Normal speech and language. No gross focal neurologic deficits are appreciated. Skin:  Skin is warm, dry and intact. No rash noted. Psychiatric: Mood and affect are normal. Speech and behavior are normal.  ____________________________________________   LABS (all labs ordered are listed, but only abnormal results are displayed)  Labs Reviewed  CBC WITH DIFFERENTIAL/PLATELET - Abnormal; Notable for the following components:      Result Value   RBC 3.62 (*)    Hemoglobin 9.5 (*)    HCT 30.2 (*)    All other components within normal limits  BASIC METABOLIC PANEL   ____________________________________________  EKG   ____________________________________________  RADIOLOGY  Right knee with no acute findings.  Degenerative changes noted.  No acute osseous injury to the right hip.  Normal alignment and no  acute bony findings to the lumbar spine.  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 ____________________________________________   PROCEDURES  Procedure(s) performed:   Procedures  Critical Care performed:   ____________________________________________   INITIAL IMPRESSION / ASSESSMENT AND PLAN / ED COURSE  Pertinent labs & imaging results that were available during my care of the patient were reviewed by me and considered in my medical decision making (see chart for details).  DDX: Muscle strain, radiculopathy, cauda equina syndrome, sciatica, hypertension, anemia As part of my medical decision making, I reviewed the following data within the electronic MEDICAL RECORD NUMBER Notes from prior ED visits  ----------------------------------------- 1:35 PM on 03/22/2018 -----------------------------------------  Patient symptoms are improved after prednisone as well as Toradol.  She is not pain-free but is able to ambulate with more comfort.  Does not have clinical findings of cauda equina.  However, I  did discuss return to the emergency department immediately for any worsening pain, weakness or numbness.  At this time, the patient does not have any new incontinence.  No saddle anesthesia.  Patient will be restarted on her blood pressure medication.  I will also start the patient on iron supplementation as her hemoglobin was 9.5 today which is the lowest noted from past records.  Heme-negative.  Patient understanding of the diagnosis well treatment and willing to comply. ____________________________________________   FINAL CLINICAL IMPRESSION(S) / ED DIAGNOSES  Low back pain.  Hypertension.  Anemia.  NEW MEDICATIONS STARTED DURING THIS VISIT:  New Prescriptions   No medications on file     Note:  This document was prepared using Dragon voice recognition software and may include unintentional dictation errors.     Myrna Blazer, MD 03/22/18 1336

## 2018-03-22 NOTE — ED Triage Notes (Signed)
Patient arrived POV for lower back pain and fall Thursday. Patient reports she was pulling booths out Thursday to sweep behind and felt herself pull her back. Later that day she was at home and her right leg gave out and she fell down a step. C/o stinging in her legs for over a year. Reports since the fall it has been hard to walk and she has been using a kitchen chair to walk around her house. Does not have a walker. Says she rubs her legs down in alcohol most nights so she can sleep. C/o bruises showing up a lot all over her body for no reason. Takes blood pressure pills and reports she ran out and cannot find a primary care doctor until March. Reports feeling "swimmy headed" sometimes when standing. Denies hitting head. Denies LOC

## 2018-03-22 NOTE — Care Management Note (Signed)
Case Management Note  Patient Details  Name: Amber Vasquez MRN: [id number] Date of Birth: 10/28/1940  Subjective/Objective:   Patient is being seen in the ED for lower back pain after a fall this past Thursday.  Patient is being discharged home from the ED, patient's sister is providing transport home.  Patient reports trouble walking because of right leg weakness, cane ordered and [facility] brought one to the bedside for patient to take home.  Patient also reports that she has had trouble finding a PCP and that her blood pressure medication ran out 2 weeks ago.  Patient has an appointment on March 9th.  RNCM was able to call [facility] and get an appointment in February with one of the Physician assistants.  PA is Mimi Merlinda Frederick and receptionist will call patient to schedule appointment.  Patient discharged home. Robbie Lis RN BSN [phone]                  Action/Plan:   Expected Discharge Date:                  Expected Discharge Plan:  Home/Self Care  In-House Referral:     Discharge planning Services  CM Consult  Post Acute Care Choice:  Durable Medical Equipment Choice offered to:  Patient  DME Arranged:  [facility] DME Agency:  [facility]  [facility] Arranged:    [facility] Agency:     Status of Service:  Completed, signed off  If discussed at Long Length of Stay Meetings, dates discussed:    Additional Comments:  Allayne Butcher, RN 03/22/2018, 2:26 PM

## 2018-03-26 ENCOUNTER — Other Ambulatory Visit: Payer: Self-pay

## 2018-03-26 ENCOUNTER — Encounter: Payer: Self-pay | Admitting: Emergency Medicine

## 2018-03-26 ENCOUNTER — Emergency Department
Admission: EM | Admit: 2018-03-26 | Discharge: 2018-03-26 | Disposition: A | Discharge status: Home / Self Care | Payer: Medicare PPO | Attending: Student in an Organized Health Care Education/Training Program | Admitting: Student in an Organized Health Care Education/Training Program

## 2018-03-26 DIAGNOSIS — M25551 Pain in right hip: Secondary | ICD-10-CM | POA: Diagnosis present

## 2018-03-26 DIAGNOSIS — Z79899 Other long term (current) drug therapy: Secondary | ICD-10-CM | POA: Insufficient documentation

## 2018-03-26 DIAGNOSIS — I10 Essential (primary) hypertension: Secondary | ICD-10-CM | POA: Insufficient documentation

## 2018-03-26 DIAGNOSIS — Z87891 Personal history of nicotine dependence: Secondary | ICD-10-CM | POA: Insufficient documentation

## 2018-03-26 DIAGNOSIS — J45909 Unspecified asthma, uncomplicated: Secondary | ICD-10-CM | POA: Insufficient documentation

## 2018-03-26 DIAGNOSIS — M5431 Sciatica, right side: Secondary | ICD-10-CM | POA: Insufficient documentation

## 2018-03-26 MED ORDER — TRAMADOL HCL 50 MG PO TABS: 50.0000 mg | ORAL_TABLET | Freq: Three times a day (TID) | ORAL | 0 refills | Status: DC | PRN

## 2018-03-26 MED ORDER — KETOROLAC TROMETHAMINE 30 MG/ML IJ SOLN
30.0000 mg | Freq: Once | INTRAMUSCULAR | Status: AC
  Administered 2018-03-26: 30 mg via INTRAMUSCULAR
  Filled 2018-03-26: qty 1

## 2018-03-26 NOTE — ED Provider Notes (Signed)
[facility] Emergency Department Provider Note   ____________________________________________   First MD Initiated Contact with Patient 03/26/18 1121     (approximate)  I have reviewed the triage vital signs and the nursing notes.   HISTORY  Chief Complaint Hip Pain   HPI Hoover BrunetteCora M Vasquez is a 78 y.o. female   presents to the ED with complaint of sciatica to her right leg.  Patient states that she was pulling some tables out from a booth which she believes is the cause of her injury.  She was seen in the ED on 03/22/2018 at which time she was given Toradol 30 mg IM and prednisone.  She states that she was improved prior to leaving the ED.  She has continued to take the prednisone but states that the pain has returned.  She also has been taking her blood pressure medication since that visit and is trying to arrange a earlier appointment with her PCP instead of waiting till March.  X-rays were done on her initial ED visit and these were reviewed.  Patient has been continuing to use her cane for added support.  She denies any incontinence of bowel or bladder other than her normal urinary incontinence.  She does continue to have pain that radiates from her lower back into her right hip and down her right leg.  Currently she rates her pain as an 8 out of 10.   Past Medical History:  Diagnosis Date  . Asthma   . Hypertension     Patient Active Problem List   Diagnosis Date Noted  . Protein-calorie malnutrition, severe 11/24/2017  . Pneumonia 11/22/2017    Past Surgical History:  Procedure Laterality Date  . ABDOMINAL HYSTERECTOMY      Prior to Admission medications   Medication Sig Start Date End Date Taking? Authorizing Provider  amLODipine (NORVASC) 10 MG tablet Take 1 tablet (10 mg total) by mouth daily. 03/22/18 03/22/19  Schaevitz, Myra Rudeavid Matthew, MD  Ascorbic Acid (VITAMIN C) 1000 MG tablet Take 1,000 mg by mouth daily.    [provider]    Cyanocobalamin (B-12) 5000 MCG CAPS Take by mouth.    [provider]  ferrous sulfate 325 (65 FE) MG tablet Take 1 tablet (325 mg total) by mouth daily. 03/22/18 03/22/19  Myrna BlazerSchaevitz, David Matthew, MD  Fluticasone-Umeclidin-Vilant (TRELEGY ELLIPTA) 100-62.5-25 MCG/INH AEPB Inhale 1 puff into the lungs daily. 12/08/17   Salena SanerGonzalez, Carmen L, MD  methylPREDNISolone (MEDROL DOSEPAK) 4 MG TBPK tablet Dispense 4 mg dose pack.  Follow instructions on the packaging. 03/22/18   Schaevitz, Myra Rudeavid Matthew, MD  traMADol (ULTRAM) 50 MG tablet Take 1 tablet (50 mg total) by mouth every 8 (eight) hours as needed for moderate pain. 03/26/18   Tommi RumpsSummers, Mikyah Alamo L, PA-C    Allergies Patient has no known allergies.  Family History  Problem Relation Age of Onset  . Hypertension Mother     Social History Social History   Tobacco Use  . Smoking status: Former Smoker    Packs/day: 0.50    Types: Cigarettes    Last attempt to quit: 11/20/2017    Years since quitting: 0.3  . Smokeless tobacco: Never Used  Substance Use Topics  . Alcohol use: Yes    Alcohol/week: 14.0 standard drinks    Types: 14 Shots of liquor per week  . Drug use: Never    Review of Systems Constitutional: No fever/chills Eyes: No visual changes. Cardiovascular: Denies chest pain. Respiratory: Denies shortness of  breath. Gastrointestinal: No abdominal pain.  No nausea, no vomiting.  Genitourinary: Negative for dysuria. Musculoskeletal: Positive for low back pain.  Positive for right leg radiculopathy. Skin: Negative for rash. Neurological: Negative for headaches, focal weakness or numbness. ___________________________________________   PHYSICAL EXAM:  VITAL SIGNS: ED Triage Vitals  Enc Vitals Group     BP 03/26/18 1028 (!) 194/63     Pulse Rate 03/26/18 1028 75     Resp 03/26/18 1028 20     Temp 03/26/18 1028 (!) 97.4 F (36.3 C)     Temp Source 03/26/18 1028 Oral     SpO2 03/26/18 1028 99 %     Weight 03/26/18  1029 135 lb (61.2 kg)     Height 03/26/18 1029 5\' 9"  (1.753 m)     Head Circumference --      Peak Flow --      Pain Score 03/26/18 1029 8     Pain Loc --      Pain Edu? --      Excl. in GC? --    Constitutional: Alert and oriented. Well appearing and in no acute distress. Eyes: Conjunctivae are normal.  Head: Atraumatic. Nose: No congestion/rhinnorhea.  Neck: No stridor.   Cardiovascular: Normal rate, regular rhythm. Grossly normal heart sounds.  Good peripheral circulation. Respiratory: Normal respiratory effort.  No retractions. Lungs CTAB. Musculoskeletal: On examination there is no gross deformity however there is moderate tenderness on palpation of the right lower lumbar area and SI joint and surrounding tissue.  No step-offs is appreciated with palpation of the lumbar spine.  Range of motion is slow and guarded.  Straight leg raises are negative.  Patient has good muscle strength is equal bilaterally. Neurologic:  Normal speech and language. No gross focal neurologic deficits are appreciated.  Patient is ambulatory with the use of a cane. Skin:  Skin is warm, dry and intact. No rash noted. Psychiatric: Mood and affect are normal. Speech and behavior are normal.  ____________________________________________   LABS (all labs ordered are listed, but only abnormal results are displayed)  Labs Reviewed - No data to display  PROCEDURES  Procedure(s) performed: None  Procedures  Critical Care performed: No  ____________________________________________   INITIAL IMPRESSION / ASSESSMENT AND PLAN / ED COURSE  As part of my medical decision making, I reviewed the following data within the electronic MEDICAL RECORD NUMBER Notes from prior ED visits and [place] Controlled Substance Database  Patient presents to the ED with complaint of low back pain with right leg radiculopathy.  She is aware that she has sciatica and this was addressed with her last visit on 03/22/2018.  Patient states she  did get relief with the injection that she received on that day.  Patient currently is finishing her prednisone pack.  She was given Toradol 30 mg IM in the ED today.  She was given a prescription for tramadol as needed for pain.  She was made aware that this medication could cause drowsiness and increase her risk for falling.  She is to follow-up with her PCP if any continued problems for further evaluation of her back.  She is to return to the emergency department if any severe worsening of her symptoms especially if she develops any incontinence of bowel or bladder.  ____________________________________________   FINAL CLINICAL IMPRESSION(S) / ED DIAGNOSES  Final diagnoses:  Sciatica of right side     ED Discharge Orders         Ordered  traMADol (ULTRAM) 50 MG tablet  Every 8 hours PRN     03/26/18 1316           Note:  This document was prepared using Dragon voice recognition software and may include unintentional dictation errors.    Tommi Rumps, PA-C 03/26/18 1606    Willy Eddy, MD 03/27/18 [phone]

## 2018-03-26 NOTE — Discharge Instructions (Addendum)
Follow-up with [facility] for any continued problems with your sciatica.  Finish the prednisone pack over the next 2 days.  The tramadol prescription that was sent to your pharmacy is for pain.  Be aware that this medication could cause drowsiness and increase your risk for falling.  Continue using your cane as needed for added support.  You may also use ice or heat to your back and right hip for comfort.  Return to the emergency department if any severe worsening of your symptoms or sudden changes.

## 2018-03-26 NOTE — ED Triage Notes (Signed)
R hip pain radiating down R leg x 1 week, states was pulling tables day before it started.

## 2018-04-14 ENCOUNTER — Ambulatory Visit: Payer: Medicare PPO | Admitting: Pulmonary Disease

## 2018-04-20 ENCOUNTER — Encounter: Payer: Self-pay | Admitting: Pulmonary Disease

## 2018-04-27 ENCOUNTER — Other Ambulatory Visit: Payer: Self-pay | Admitting: Physician Assistant

## 2018-04-27 DIAGNOSIS — Z1231 Encounter for screening mammogram for malignant neoplasm of breast: Secondary | ICD-10-CM

## 2018-05-02 ENCOUNTER — Ambulatory Visit: Payer: Self-pay | Admitting: Family Medicine

## 2018-08-23 DIAGNOSIS — M810 Age-related osteoporosis without current pathological fracture: Secondary | ICD-10-CM | POA: Diagnosis not present

## 2018-08-31 DIAGNOSIS — F17200 Nicotine dependence, unspecified, uncomplicated: Secondary | ICD-10-CM | POA: Diagnosis not present

## 2018-08-31 DIAGNOSIS — Z8701 Personal history of pneumonia (recurrent): Secondary | ICD-10-CM | POA: Diagnosis not present

## 2018-08-31 DIAGNOSIS — M810 Age-related osteoporosis without current pathological fracture: Secondary | ICD-10-CM | POA: Diagnosis not present

## 2018-08-31 DIAGNOSIS — M170 Bilateral primary osteoarthritis of knee: Secondary | ICD-10-CM | POA: Diagnosis not present

## 2018-08-31 DIAGNOSIS — R634 Abnormal weight loss: Secondary | ICD-10-CM | POA: Diagnosis not present

## 2018-08-31 DIAGNOSIS — J189 Pneumonia, unspecified organism: Secondary | ICD-10-CM | POA: Diagnosis not present

## 2018-09-07 DIAGNOSIS — M25561 Pain in right knee: Secondary | ICD-10-CM | POA: Diagnosis not present

## 2018-09-07 DIAGNOSIS — M5136 Other intervertebral disc degeneration, lumbar region: Secondary | ICD-10-CM | POA: Diagnosis not present

## 2018-09-07 DIAGNOSIS — M1711 Unilateral primary osteoarthritis, right knee: Secondary | ICD-10-CM | POA: Diagnosis not present

## 2018-09-07 DIAGNOSIS — M170 Bilateral primary osteoarthritis of knee: Secondary | ICD-10-CM | POA: Diagnosis not present

## 2018-09-07 DIAGNOSIS — M1712 Unilateral primary osteoarthritis, left knee: Secondary | ICD-10-CM | POA: Diagnosis not present

## 2018-09-07 DIAGNOSIS — M5441 Lumbago with sciatica, right side: Secondary | ICD-10-CM | POA: Diagnosis not present

## 2018-09-07 DIAGNOSIS — G8929 Other chronic pain: Secondary | ICD-10-CM | POA: Diagnosis not present

## 2018-09-07 DIAGNOSIS — M25562 Pain in left knee: Secondary | ICD-10-CM | POA: Diagnosis not present

## 2018-09-07 DIAGNOSIS — M79604 Pain in right leg: Secondary | ICD-10-CM | POA: Diagnosis not present

## 2018-09-08 ENCOUNTER — Other Ambulatory Visit: Payer: Self-pay | Admitting: Sports Medicine

## 2018-09-08 DIAGNOSIS — M25562 Pain in left knee: Secondary | ICD-10-CM

## 2018-09-08 DIAGNOSIS — M79604 Pain in right leg: Secondary | ICD-10-CM

## 2018-09-08 DIAGNOSIS — G8929 Other chronic pain: Secondary | ICD-10-CM

## 2018-09-08 DIAGNOSIS — M5136 Other intervertebral disc degeneration, lumbar region: Secondary | ICD-10-CM

## 2018-09-22 ENCOUNTER — Ambulatory Visit: Admission: RE | Admit: 2018-09-22 | Payer: Medicare HMO | Source: Ambulatory Visit

## 2018-11-09 DIAGNOSIS — D649 Anemia, unspecified: Secondary | ICD-10-CM | POA: Diagnosis not present

## 2018-11-09 DIAGNOSIS — F439 Reaction to severe stress, unspecified: Secondary | ICD-10-CM | POA: Diagnosis not present

## 2018-11-09 DIAGNOSIS — M5136 Other intervertebral disc degeneration, lumbar region: Secondary | ICD-10-CM | POA: Diagnosis not present

## 2018-11-09 DIAGNOSIS — I10 Essential (primary) hypertension: Secondary | ICD-10-CM | POA: Diagnosis not present

## 2018-11-09 DIAGNOSIS — R05 Cough: Secondary | ICD-10-CM | POA: Diagnosis not present

## 2018-11-09 DIAGNOSIS — Z8701 Personal history of pneumonia (recurrent): Secondary | ICD-10-CM | POA: Diagnosis not present

## 2018-11-09 DIAGNOSIS — Z79899 Other long term (current) drug therapy: Secondary | ICD-10-CM | POA: Diagnosis not present

## 2018-11-09 DIAGNOSIS — Z7951 Long term (current) use of inhaled steroids: Secondary | ICD-10-CM | POA: Diagnosis not present

## 2018-11-09 DIAGNOSIS — Z9189 Other specified personal risk factors, not elsewhere classified: Secondary | ICD-10-CM | POA: Diagnosis not present

## 2018-11-23 IMAGING — CR DG CHEST 2V
1 series · 2 of 2 positions shown · non-contrast
Comparison: Chest x-ray and chest CT scan November 22, 2017

CLINICAL DATA: Recent episode of right middle lobe pneumonia

EXAM:
CHEST - 2 VIEW

[Series 1: dg chest 2 view · 0.14mm/px · 2 of 2 slices shown]
[im 1/2]
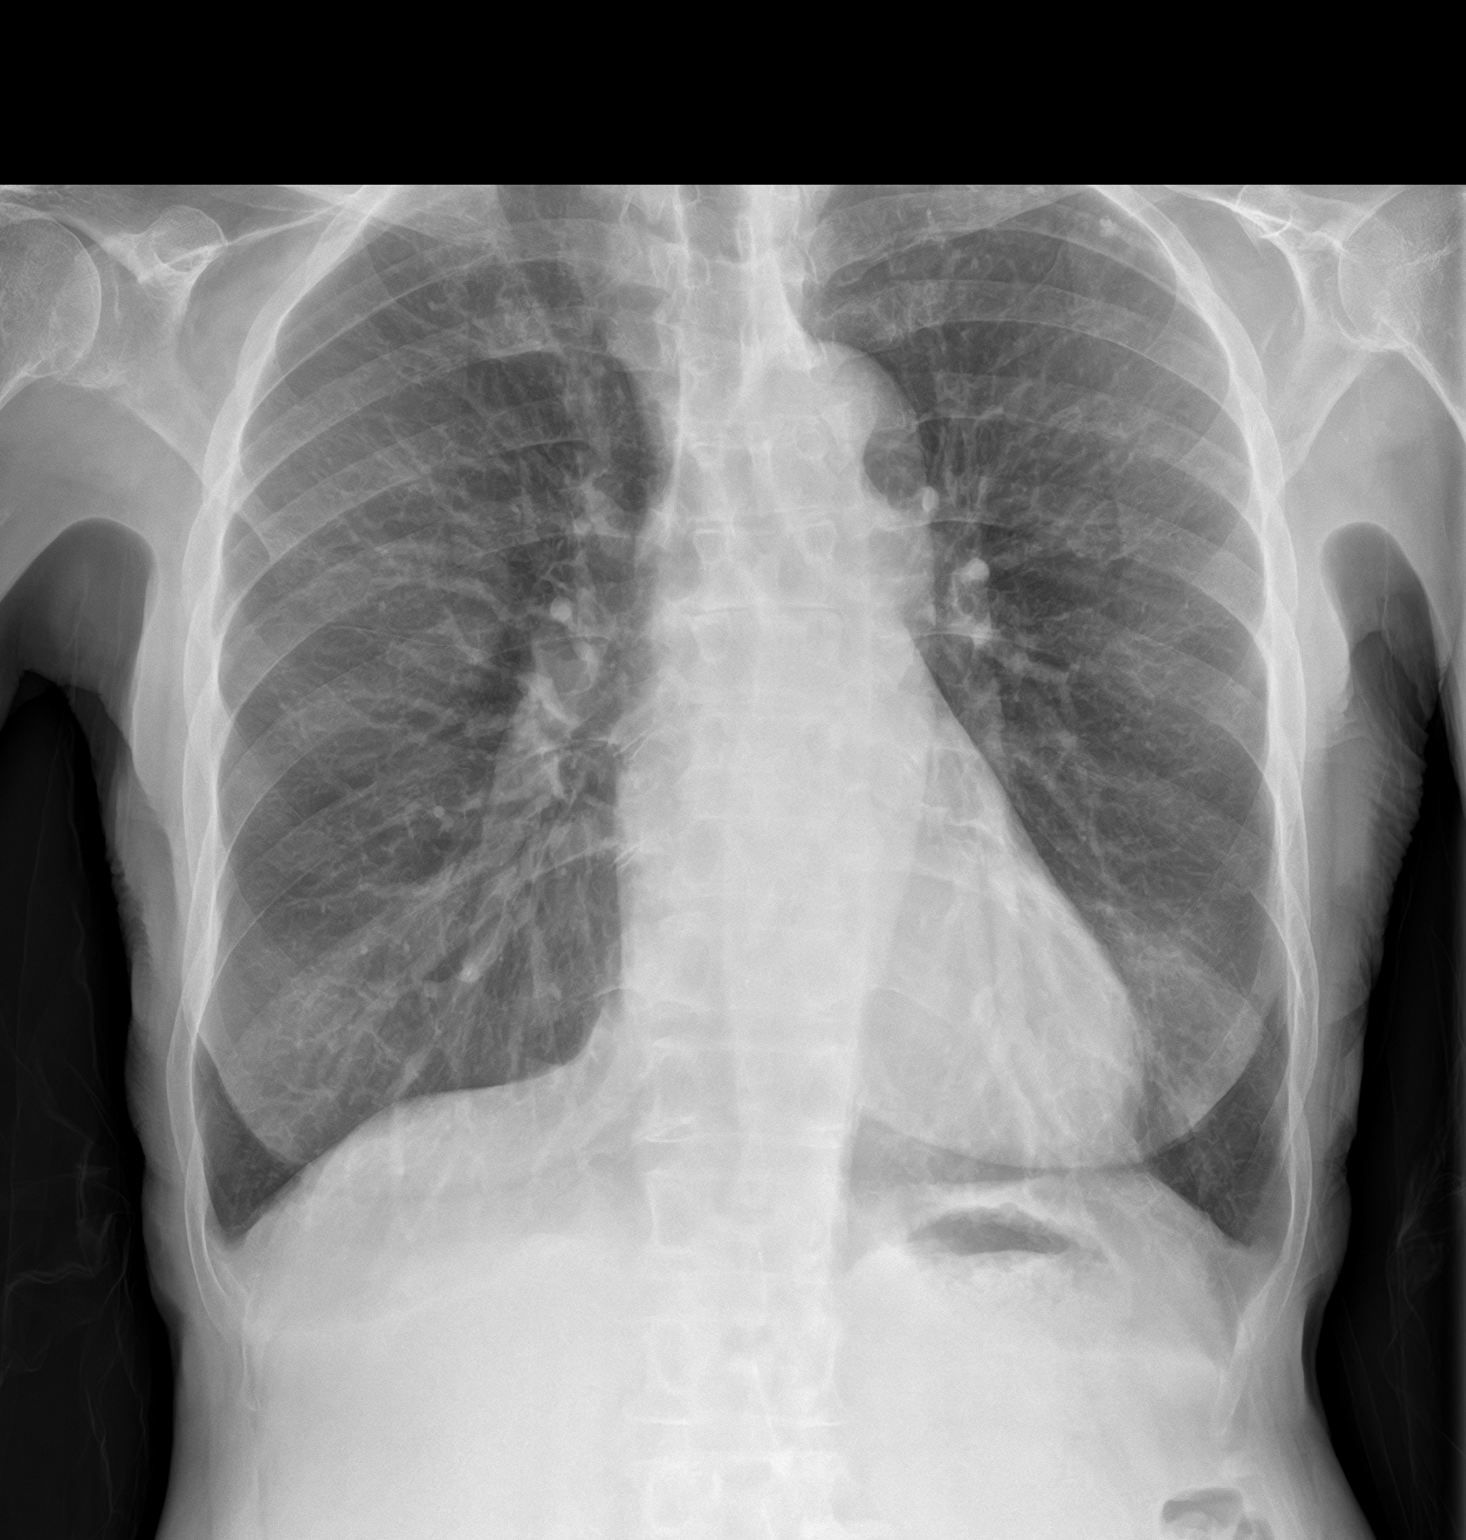
[im 2/2]
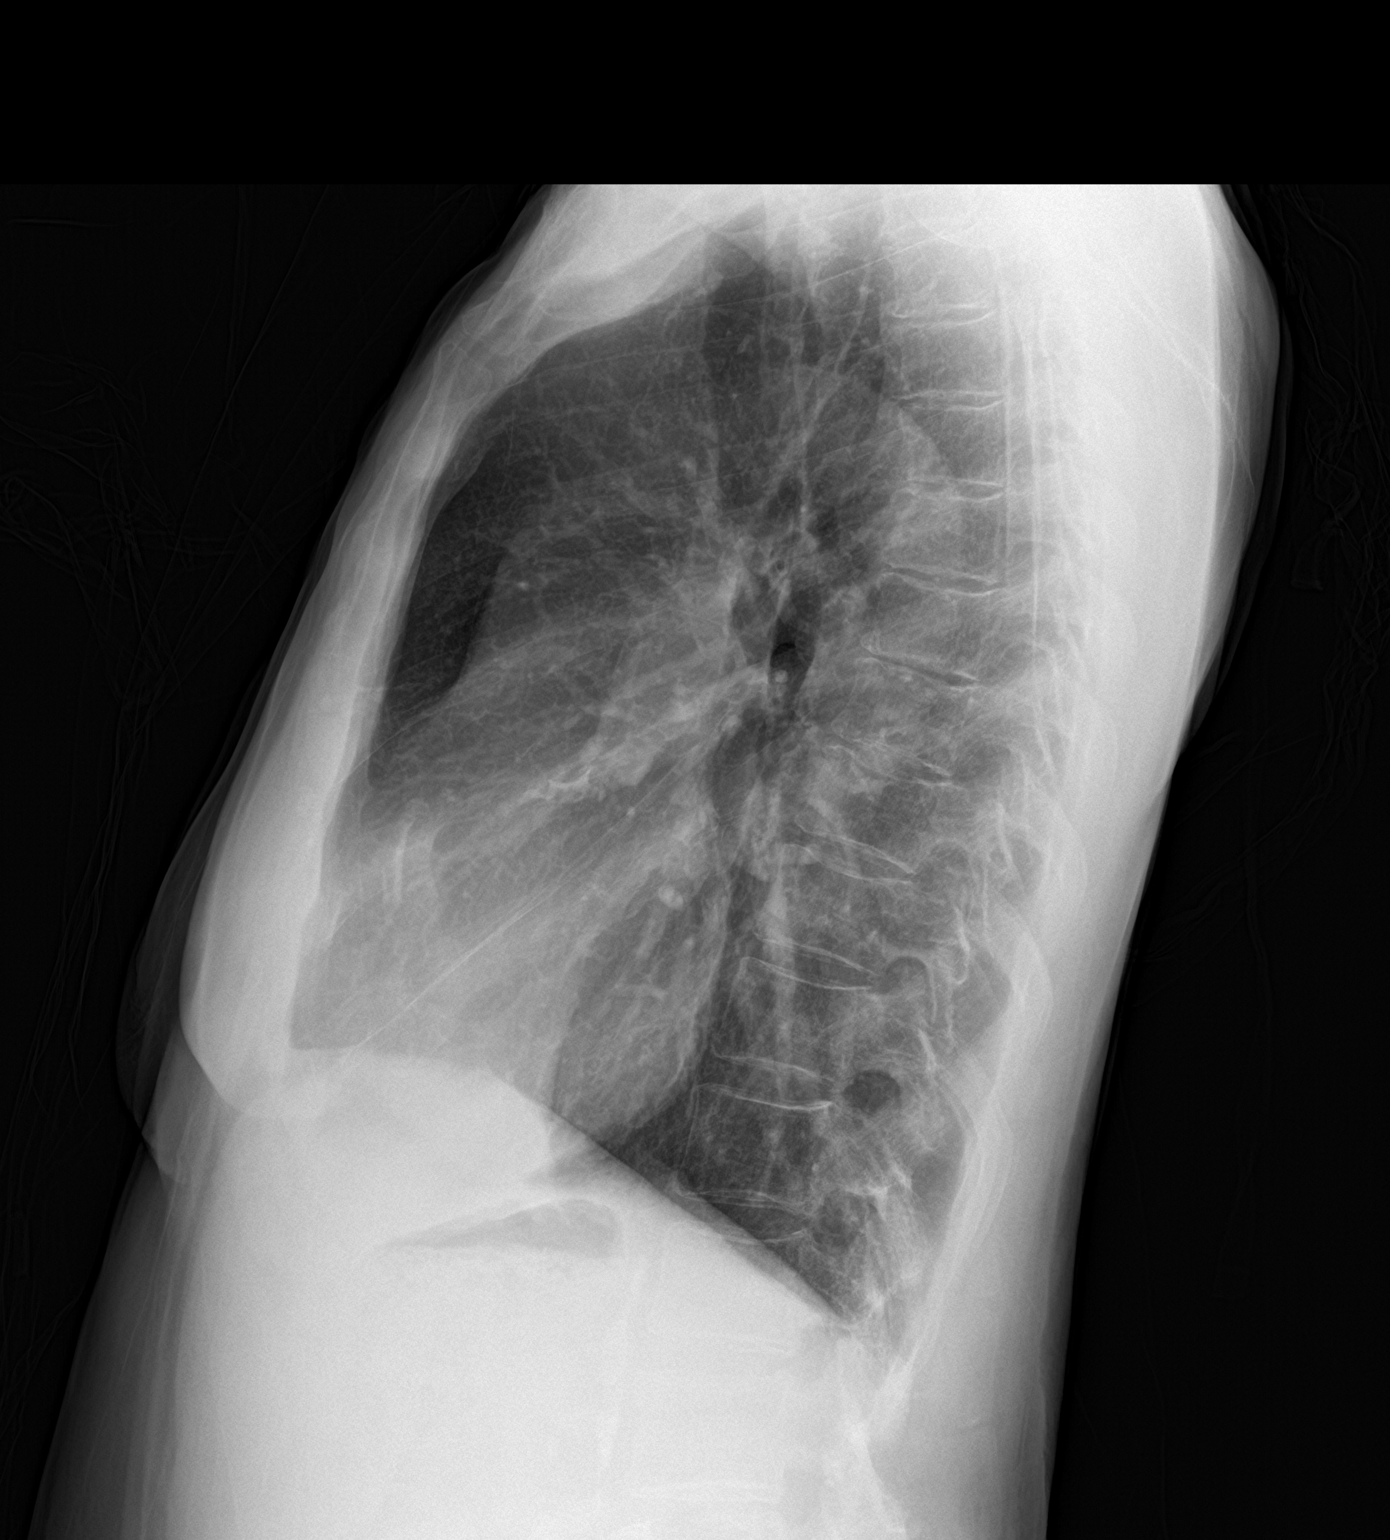

[2 of 2 positions shown; findings below may reference images not displayed]

FINDINGS: The lungs are mildly hyperinflated with hemidiaphragm flattening..
There is persistent subtle density in the right middle lobe
anteriorly which may reflect scarring and was similar in appearance
to that seen on a chest x-ray June 07, 2016. The focal
infiltrate observed on the chest x-ray November 22, 2017 is no
longer evident. The left lung is clear. There is no pleural
effusion. The heart and pulmonary vascularity are normal. There are
stable calcified foci peripherally in the left upper lobe. The bony
thorax exhibits no acute abnormality.
IMPRESSION: Interval resolution of right middle lobe pneumonia. Stable chronic
bronchitic-reactive airway changes.

## 2019-02-07 DIAGNOSIS — R05 Cough: Secondary | ICD-10-CM | POA: Diagnosis not present

## 2019-02-07 DIAGNOSIS — E782 Mixed hyperlipidemia: Secondary | ICD-10-CM | POA: Diagnosis not present

## 2019-02-07 DIAGNOSIS — F17210 Nicotine dependence, cigarettes, uncomplicated: Secondary | ICD-10-CM | POA: Diagnosis not present

## 2019-02-07 DIAGNOSIS — R5383 Other fatigue: Secondary | ICD-10-CM | POA: Diagnosis not present

## 2019-02-07 DIAGNOSIS — I10 Essential (primary) hypertension: Secondary | ICD-10-CM | POA: Diagnosis not present

## 2019-02-07 DIAGNOSIS — D649 Anemia, unspecified: Secondary | ICD-10-CM | POA: Diagnosis not present

## 2019-02-07 DIAGNOSIS — M5136 Other intervertebral disc degeneration, lumbar region: Secondary | ICD-10-CM | POA: Diagnosis not present

## 2019-05-23 DIAGNOSIS — H524 Presbyopia: Secondary | ICD-10-CM | POA: Diagnosis not present

## 2019-05-31 DIAGNOSIS — Z0100 Encounter for examination of eyes and vision without abnormal findings: Secondary | ICD-10-CM | POA: Diagnosis not present

## 2019-06-06 DIAGNOSIS — R221 Localized swelling, mass and lump, neck: Secondary | ICD-10-CM | POA: Diagnosis not present

## 2019-06-06 DIAGNOSIS — D649 Anemia, unspecified: Secondary | ICD-10-CM | POA: Diagnosis not present

## 2019-06-06 DIAGNOSIS — F430 Acute stress reaction: Secondary | ICD-10-CM | POA: Diagnosis not present

## 2019-06-06 DIAGNOSIS — R05 Cough: Secondary | ICD-10-CM | POA: Diagnosis not present

## 2019-06-06 DIAGNOSIS — R252 Cramp and spasm: Secondary | ICD-10-CM | POA: Diagnosis not present

## 2019-06-06 DIAGNOSIS — M545 Low back pain: Secondary | ICD-10-CM | POA: Diagnosis not present

## 2019-06-06 DIAGNOSIS — Z0000 Encounter for general adult medical examination without abnormal findings: Secondary | ICD-10-CM | POA: Diagnosis not present

## 2019-06-06 DIAGNOSIS — E782 Mixed hyperlipidemia: Secondary | ICD-10-CM | POA: Diagnosis not present

## 2019-06-06 DIAGNOSIS — Z79899 Other long term (current) drug therapy: Secondary | ICD-10-CM | POA: Diagnosis not present

## 2019-06-06 DIAGNOSIS — I10 Essential (primary) hypertension: Secondary | ICD-10-CM | POA: Diagnosis not present

## 2019-06-06 DIAGNOSIS — R5383 Other fatigue: Secondary | ICD-10-CM | POA: Diagnosis not present

## 2019-06-22 DIAGNOSIS — H18413 Arcus senilis, bilateral: Secondary | ICD-10-CM | POA: Diagnosis not present

## 2019-06-22 DIAGNOSIS — H25013 Cortical age-related cataract, bilateral: Secondary | ICD-10-CM | POA: Diagnosis not present

## 2019-06-22 DIAGNOSIS — H25043 Posterior subcapsular polar age-related cataract, bilateral: Secondary | ICD-10-CM | POA: Diagnosis not present

## 2019-06-22 DIAGNOSIS — H2512 Age-related nuclear cataract, left eye: Secondary | ICD-10-CM | POA: Diagnosis not present

## 2019-06-22 DIAGNOSIS — H2513 Age-related nuclear cataract, bilateral: Secondary | ICD-10-CM | POA: Diagnosis not present

## 2019-08-02 DIAGNOSIS — H2512 Age-related nuclear cataract, left eye: Secondary | ICD-10-CM | POA: Diagnosis not present

## 2019-08-03 DIAGNOSIS — H2511 Age-related nuclear cataract, right eye: Secondary | ICD-10-CM | POA: Diagnosis not present

## 2020-06-24 DIAGNOSIS — F17200 Nicotine dependence, unspecified, uncomplicated: Secondary | ICD-10-CM | POA: Diagnosis not present

## 2020-06-24 DIAGNOSIS — I10 Essential (primary) hypertension: Secondary | ICD-10-CM | POA: Diagnosis not present

## 2020-06-24 DIAGNOSIS — E782 Mixed hyperlipidemia: Secondary | ICD-10-CM | POA: Diagnosis not present

## 2020-06-24 DIAGNOSIS — D649 Anemia, unspecified: Secondary | ICD-10-CM | POA: Diagnosis not present

## 2020-06-24 DIAGNOSIS — Z0000 Encounter for general adult medical examination without abnormal findings: Secondary | ICD-10-CM | POA: Diagnosis not present

## 2020-06-24 DIAGNOSIS — R634 Abnormal weight loss: Secondary | ICD-10-CM | POA: Diagnosis not present

## 2020-06-24 DIAGNOSIS — M25511 Pain in right shoulder: Secondary | ICD-10-CM | POA: Diagnosis not present

## 2020-06-24 DIAGNOSIS — G8929 Other chronic pain: Secondary | ICD-10-CM | POA: Diagnosis not present

## 2020-06-24 DIAGNOSIS — R059 Cough, unspecified: Secondary | ICD-10-CM | POA: Diagnosis not present

## 2020-06-24 DIAGNOSIS — R053 Chronic cough: Secondary | ICD-10-CM | POA: Diagnosis not present

## 2020-07-08 DIAGNOSIS — E782 Mixed hyperlipidemia: Secondary | ICD-10-CM | POA: Diagnosis not present

## 2020-07-08 DIAGNOSIS — I10 Essential (primary) hypertension: Secondary | ICD-10-CM | POA: Diagnosis not present

## 2020-07-08 DIAGNOSIS — R634 Abnormal weight loss: Secondary | ICD-10-CM | POA: Diagnosis not present

## 2020-08-05 DIAGNOSIS — M75121 Complete rotator cuff tear or rupture of right shoulder, not specified as traumatic: Secondary | ICD-10-CM | POA: Diagnosis not present

## 2020-08-05 DIAGNOSIS — M12811 Other specific arthropathies, not elsewhere classified, right shoulder: Secondary | ICD-10-CM | POA: Diagnosis not present

## 2020-08-05 DIAGNOSIS — M25511 Pain in right shoulder: Secondary | ICD-10-CM | POA: Diagnosis not present

## 2020-11-03 ENCOUNTER — Emergency Department
Admission: EM | Admit: 2020-11-03 | Discharge: 2020-11-03 | Disposition: A | Discharge status: Home / Self Care | Payer: Medicare Other | Attending: Emergency Medicine | Admitting: Emergency Medicine

## 2020-11-03 ENCOUNTER — Emergency Department: Payer: Medicare Other

## 2020-11-03 ENCOUNTER — Other Ambulatory Visit: Payer: Self-pay

## 2020-11-03 DIAGNOSIS — Z87891 Personal history of nicotine dependence: Secondary | ICD-10-CM | POA: Insufficient documentation

## 2020-11-03 DIAGNOSIS — Z7951 Long term (current) use of inhaled steroids: Secondary | ICD-10-CM | POA: Diagnosis not present

## 2020-11-03 DIAGNOSIS — R21 Rash and other nonspecific skin eruption: Secondary | ICD-10-CM | POA: Insufficient documentation

## 2020-11-03 DIAGNOSIS — G44209 Tension-type headache, unspecified, not intractable: Secondary | ICD-10-CM | POA: Insufficient documentation

## 2020-11-03 DIAGNOSIS — R233 Spontaneous ecchymoses: Secondary | ICD-10-CM | POA: Diagnosis not present

## 2020-11-03 DIAGNOSIS — R202 Paresthesia of skin: Secondary | ICD-10-CM | POA: Insufficient documentation

## 2020-11-03 DIAGNOSIS — J45909 Unspecified asthma, uncomplicated: Secondary | ICD-10-CM | POA: Insufficient documentation

## 2020-11-03 DIAGNOSIS — L299 Pruritus, unspecified: Secondary | ICD-10-CM | POA: Insufficient documentation

## 2020-11-03 DIAGNOSIS — Z79899 Other long term (current) drug therapy: Secondary | ICD-10-CM | POA: Insufficient documentation

## 2020-11-03 DIAGNOSIS — I10 Essential (primary) hypertension: Secondary | ICD-10-CM | POA: Insufficient documentation

## 2020-11-03 LAB — CBC WITH DIFFERENTIAL/PLATELET
Abs Immature Granulocytes: 0.01 10*3/uL (ref 0.00–0.07)
Basophils Absolute: 0.1 10*3/uL (ref 0.0–0.1)
Basophils Relative: 1 %
Eosinophils Absolute: 0.1 10*3/uL (ref 0.0–0.5)
Eosinophils Relative: 2 %
HCT: 32.9 % — ABNORMAL LOW (ref 36.0–46.0)
Hemoglobin: 10.9 g/dL — ABNORMAL LOW (ref 12.0–15.0)
Immature Granulocytes: 0 %
Lymphocytes Relative: 36 %
Lymphs Abs: 2.1 10*3/uL (ref 0.7–4.0)
MCH: 26.7 pg (ref 26.0–34.0)
MCHC: 33.1 g/dL (ref 30.0–36.0)
MCV: 80.4 fL (ref 80.0–100.0)
Monocytes Absolute: 0.6 10*3/uL (ref 0.1–1.0)
Monocytes Relative: 10 %
Neutro Abs: 3 10*3/uL (ref 1.7–7.7)
Neutrophils Relative %: 51 %
Platelets: 233 10*3/uL (ref 150–400)
RBC: 4.09 MIL/uL (ref 3.87–5.11)
RDW: 14.7 % (ref 11.5–15.5)
WBC: 5.8 10*3/uL (ref 4.0–10.5)
nRBC: 0 % (ref 0.0–0.2)

## 2020-11-03 LAB — APTT: aPTT: 29 seconds (ref 24–36)

## 2020-11-03 LAB — COMPREHENSIVE METABOLIC PANEL
ALT: 10 U/L (ref 0–44)
AST: 20 U/L (ref 15–41)
Albumin: 3.8 g/dL (ref 3.5–5.0)
Alkaline Phosphatase: 78 U/L (ref 38–126)
Anion gap: 7 (ref 5–15)
BUN: 37 mg/dL — ABNORMAL HIGH (ref 8–23)
CO2: 26 mmol/L (ref 22–32)
Calcium: 8.9 mg/dL (ref 8.9–10.3)
Chloride: 106 mmol/L (ref 98–111)
Creatinine, Ser: 1.24 mg/dL — ABNORMAL HIGH (ref 0.44–1.00)
GFR, Estimated: 44 mL/min — ABNORMAL LOW (ref >60)
Glucose, Bld: 94 mg/dL (ref 70–99)
Potassium: 4.9 mmol/L (ref 3.5–5.1)
Sodium: 139 mmol/L (ref 135–145)
Total Bilirubin: 0.8 mg/dL (ref 0.3–1.2)
Total Protein: 6.8 g/dL (ref 6.5–8.1)

## 2020-11-03 LAB — PROTIME-INR
INR: 1 (ref 0.8–1.2)
Prothrombin Time: 13.6 seconds (ref 11.4–15.2)

## 2020-11-03 LAB — SEDIMENTATION RATE: Sed Rate: 28 mm/hr (ref 0–30)

## 2020-11-03 MED ORDER — METHYLPREDNISOLONE 4 MG PO TBPK: ORAL_TABLET | ORAL | 0 refills | Status: AC

## 2020-11-03 NOTE — ED Triage Notes (Signed)
Pt comes with c/o headache and rash. Pt states rash like pins and needles feeling to bilateral legs.   Pt states rash for three weeks the patient states she was prescribed antibiotics with no relief.

## 2020-11-03 NOTE — ED Notes (Signed)
RN to bedside. Pt has rash to lower legs x 3 weeks. Has been seen by MD for it and was prescribed an antibiotic and it isnt getting any better. She came in today bc she could not sleep last night due to her itching. Pt also has headache x 3 weeks as well. Pt in no acute distress. Pt has been taking OTC BC for her headache,.

## 2020-11-03 NOTE — ED Notes (Signed)
Patient taken to CT scan.

## 2020-11-03 NOTE — ED Provider Notes (Signed)
[facility] Emergency Department Provider Note  ____________________________________________   Event Date/Time   First MD Initiated Contact with Patient 11/03/20 1112     (approximate)  I have reviewed the triage vital signs and the nursing notes.   HISTORY  Chief Complaint Headache and Rash    HPI Amber Vasquez is a 80 y.o. female presents emergency department with headache and rash to lower legs.  States that the rash started last week and she took an antibiotic without any relief.  States it is a burning and itching sensation.  States when she goes to work the areas will blister up.  Headache is located at the right temporal area.  States that it comes and goes but is a throbbing sensation.  Past Medical History:  Diagnosis Date   Asthma    Hypertension     Patient Active Problem List   Diagnosis Date Noted   Protein-calorie malnutrition, severe 11/24/2017   Pneumonia 11/22/2017    Past Surgical History:  Procedure Laterality Date   ABDOMINAL HYSTERECTOMY      Prior to Admission medications   Medication Sig Start Date End Date Taking? Authorizing Provider  methylPREDNISolone (MEDROL DOSEPAK) 4 MG TBPK tablet Take 6 pills on day one then decrease by 1 pill each day 11/03/20  Yes Gavyn Zoss, Roselyn Bering, PA-C  amLODipine (NORVASC) 10 MG tablet Take 1 tablet (10 mg total) by mouth daily. 03/22/18 03/22/19  Schaevitz, Myra Rude, MD  Ascorbic Acid (VITAMIN C) 1000 MG tablet Take 1,000 mg by mouth daily.    [provider]  Cyanocobalamin (B-12) 5000 MCG CAPS Take by mouth.    [provider]  ferrous sulfate 325 (65 FE) MG tablet Take 1 tablet (325 mg total) by mouth daily. 03/22/18 03/22/19  Myrna Blazer, MD  Fluticasone-Umeclidin-Vilant (TRELEGY ELLIPTA) 100-62.5-25 MCG/INH AEPB Inhale 1 puff into the lungs daily. 12/08/17   Salena Saner, MD    Allergies Patient has no known allergies.  Family History  Problem  Relation Age of Onset   Hypertension Mother     Social History Social History   Tobacco Use   Smoking status: Former    Packs/day: 0.50    Types: Cigarettes    Quit date: 11/20/2017    Years since quitting: 2.9   Smokeless tobacco: Never  Vaping Use   Vaping Use: Never used  Substance Use Topics   Alcohol use: Yes    Alcohol/week: 14.0 standard drinks    Types: 14 Shots of liquor per week   Drug use: Never    Review of Systems  Constitutional: No fever/chills Eyes: No visual changes. ENT: No sore throat. Respiratory: Denies cough Cardiovascular: Denies chest pain Gastrointestinal: Denies abdominal pain Genitourinary: Negative for dysuria. Musculoskeletal: Negative for back pain. Skin: Positive for rash. Psychiatric: no mood changes,     ____________________________________________   PHYSICAL EXAM:  VITAL SIGNS: ED Triage Vitals [11/03/20 1026]  Enc Vitals Group     BP (!) 133/113     Pulse Rate 81     Resp 18     Temp 98 F (36.7 C)     Temp src      SpO2 100 %     Weight      Height      Head Circumference      Peak Flow      Pain Score 6     Pain Loc      Pain Edu?  Excl. in GC?     Constitutional: Alert and oriented. Well appearing and in no acute distress. Eyes: Conjunctivae are normal.  Head: Atraumatic.  Right temporal area is negative for swollen tender artery, right parietal area is mildly tender, no rash noted on the scalp Nose: No congestion/rhinnorhea. Mouth/Throat: Mucous membranes are moist.   Neck:  supple no lymphadenopathy noted Cardiovascular: Normal rate, regular rhythm. Heart sounds are normal Respiratory: Normal respiratory effort.  No retractions, lungs c t a  GU: deferred Musculoskeletal: FROM all extremities, warm and well perfused, red nonblanching rash noted along the lower extremities, bruise noted on the left leg, areas are not raised, Neurologic:  Normal speech and language.  Skin:  Skin is warm, dry and intact.  rash noted. Psychiatric: Mood and affect are normal. Speech and behavior are normal.  ____________________________________________   LABS (all labs ordered are listed, but only abnormal results are displayed)  Labs Reviewed  COMPREHENSIVE METABOLIC PANEL - Abnormal; Notable for the following components:      Result Value   BUN 37 (*)    Creatinine, Ser 1.24 (*)    GFR, Estimated 44 (*)    All other components within normal limits  CBC WITH DIFFERENTIAL/PLATELET - Abnormal; Notable for the following components:   Hemoglobin 10.9 (*)    HCT 32.9 (*)    All other components within normal limits  SEDIMENTATION RATE  PROTIME-INR  APTT   ____________________________________________   ____________________________________________  RADIOLOGY  CT of the head  ____________________________________________   PROCEDURES  Procedure(s) performed: No  Procedures    ____________________________________________   INITIAL IMPRESSION / ASSESSMENT AND PLAN / ED COURSE  Pertinent labs & imaging results that were available during my care of the patient were reviewed by me and considered in my medical decision making (see chart for details).   The patient 80 year old female presents with a rash and headache.  See HPI.  Physical exam shows patient per stable  DDx: Temporal arteritis, petechiae, thrombocytopenia  Labs and CT of the head ordered  CT of the head reviewed by me confirmed by radiology be negative for any acute abnormality  Labs are reassuring, CBC, metabolic panel, sed rate are all normal.  Patient is getting angry that we are taking so long.  States she wants to go ahead and go home.  Since we are only waiting for PT/PTT I do feel it is okay to let her go home.  She can return to the emergency department worsening.  She given prescription for Medrol Dosepak for the rash on her legs.  Feel that she is most likely having more of a tension headache as it comes and goes.   Patient wanted to work note.  She is discharged stable condition.     Amber Vasquez was evaluated in Emergency Department on 11/03/2020 for the symptoms described in the history of present illness. She was evaluated in the context of the global COVID-19 pandemic, which necessitated consideration that the patient might be at risk for infection with the SARS-CoV-2 virus that causes COVID-19. Institutional protocols and algorithms that pertain to the evaluation of patients at risk for COVID-19 are in a state of rapid change based on information released by regulatory bodies including the CDC and federal and state organizations. These policies and algorithms were followed during the patient's care in the ED.    As part of my medical decision making, I reviewed the following data within the electronic MEDICAL RECORD NUMBER Nursing notes reviewed and  incorporated, Labs reviewed , Old chart reviewed, Radiograph reviewed , Notes from prior ED visits, and [place] Controlled Substance Database  ____________________________________________   FINAL CLINICAL IMPRESSION(S) / ED DIAGNOSES  Final diagnoses:  Rash  Tension headache      NEW MEDICATIONS STARTED DURING THIS VISIT:  Discharge Medication List as of 11/03/2020  1:45 PM       Note:  This document was prepared using Dragon voice recognition software and may include unintentional dictation errors.    Faythe Ghee, PA-C 11/03/20 1451    Georga Hacking, MD 11/03/20 2115

## 2020-11-03 NOTE — ED Notes (Signed)
Patient is alert and oriented, full movement of all extremities, able to take out earrings and fix hair without any difficulties.

## 2020-11-03 NOTE — ED Notes (Signed)
Patient states she is ready to go home. PA-C informed.

## 2021-09-23 IMAGING — CT CT HEAD W/O CM
3 series · 16 of 47 positions shown, 19 images · non-contrast
Comparison: 03/23/2015

CLINICAL DATA: Headache.  Bilateral leg paresthesias.

EXAM:
CT HEAD WITHOUT CONTRAST
TECHNIQUE: Contiguous axial images were obtained from the base of the skull
through the vertex without intravenous contrast.

[Series 3: head wo · axial · 0.40mm/px · z∈[-73,+52]mm · 10 of 30 slices shown, 13 images]
[im 3/30  brain]
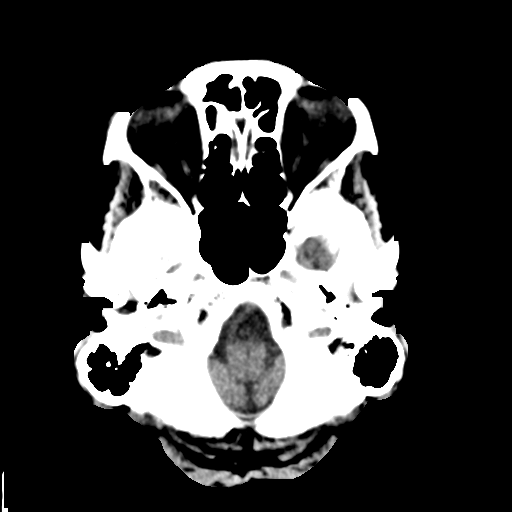
[im 3/30  bone]
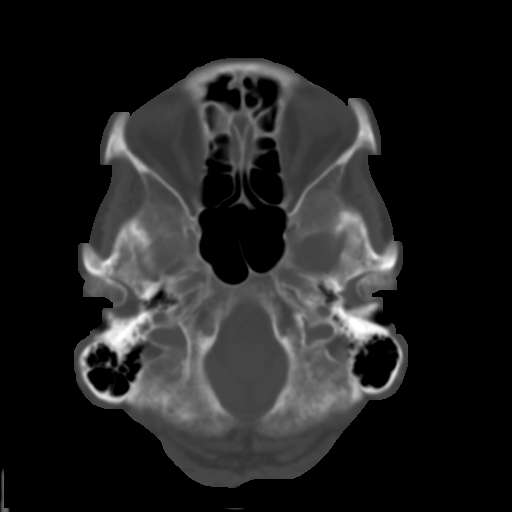
[im 6/30  brain]
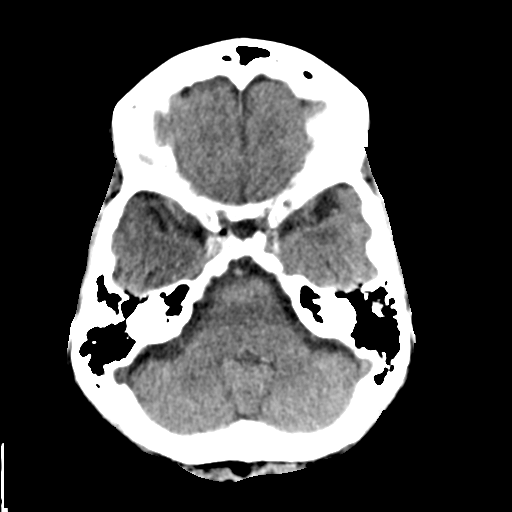
[im 9/30  brain]
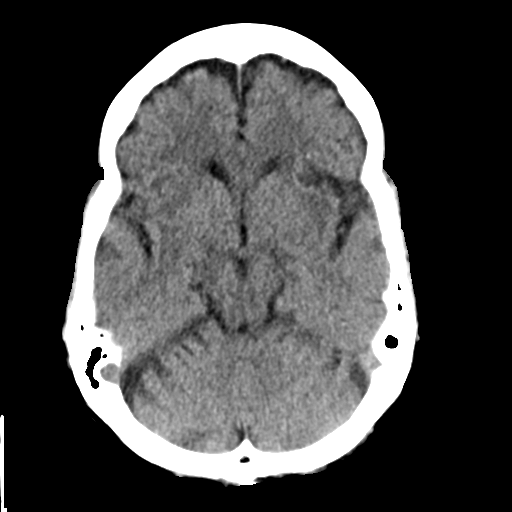
[im 11/30  brain]
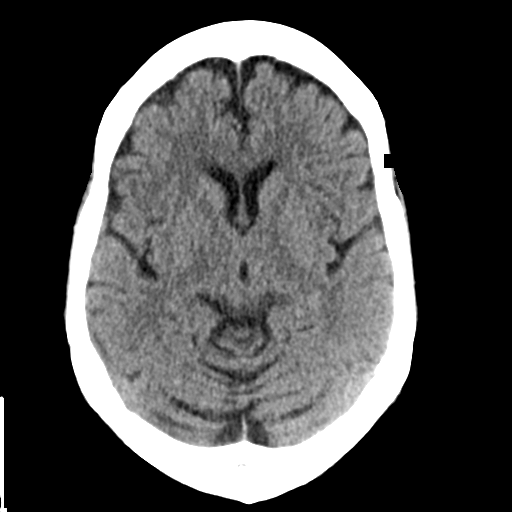
[im 14/30  brain]
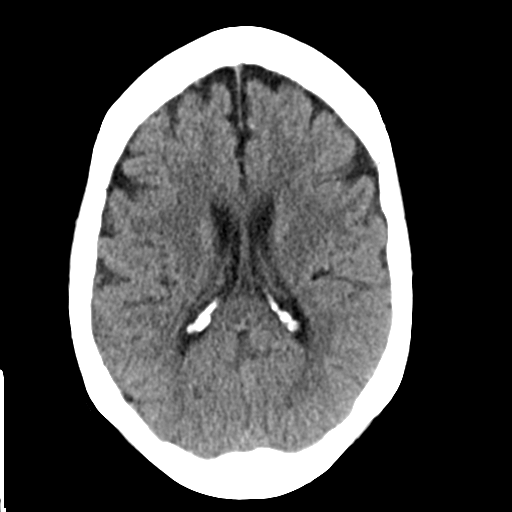
[im 14/30  bone]
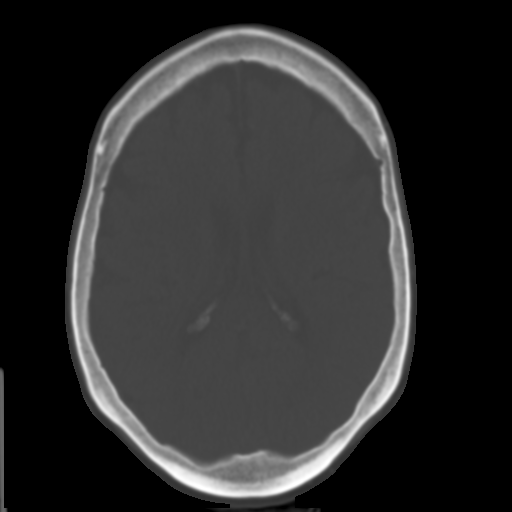
[im 17/30  brain]
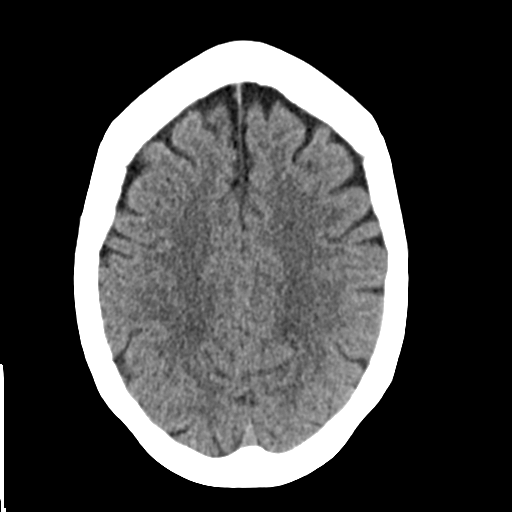
[im 20/30  brain]
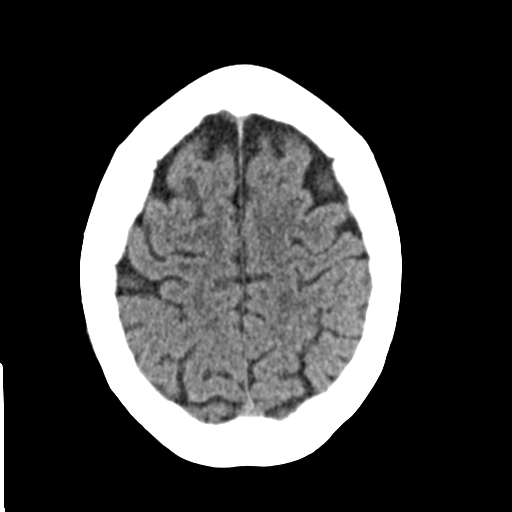
[im 23/30  brain]
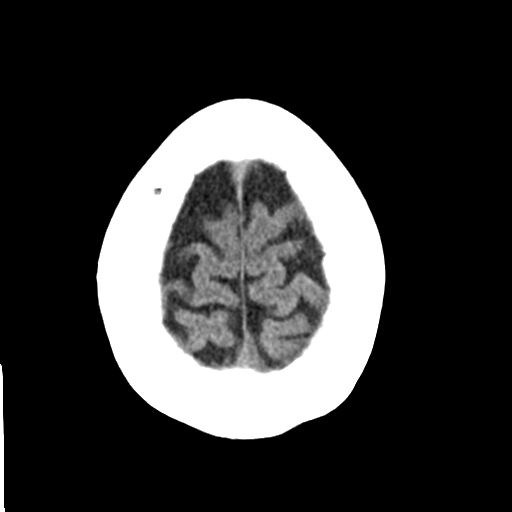
[im 25/30  brain]
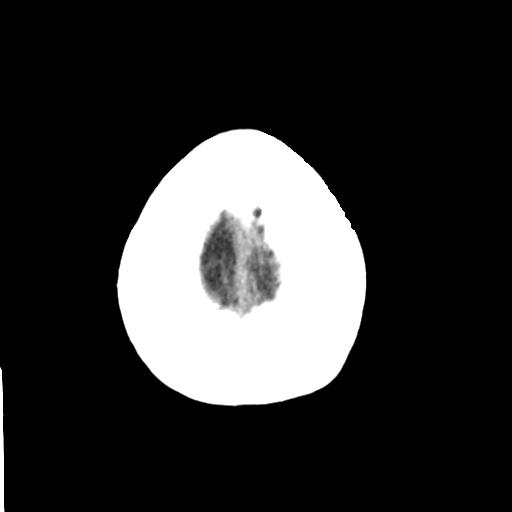
[im 25/30  bone]
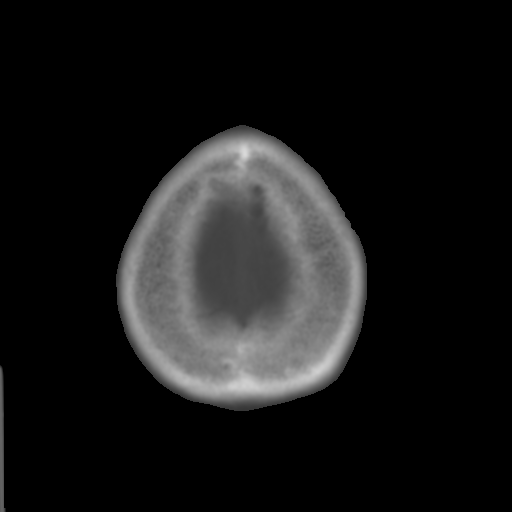
[im 28/30  brain]
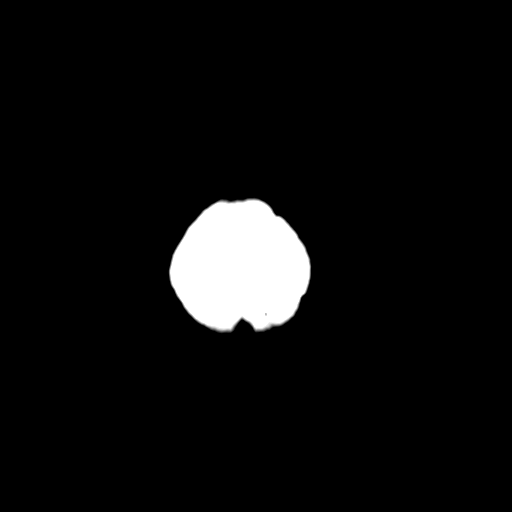

[Series 4: coronal soft tissue · coronal · 0.30mm/px · 3 of 66 slices shown]
[im 23/66  brain]
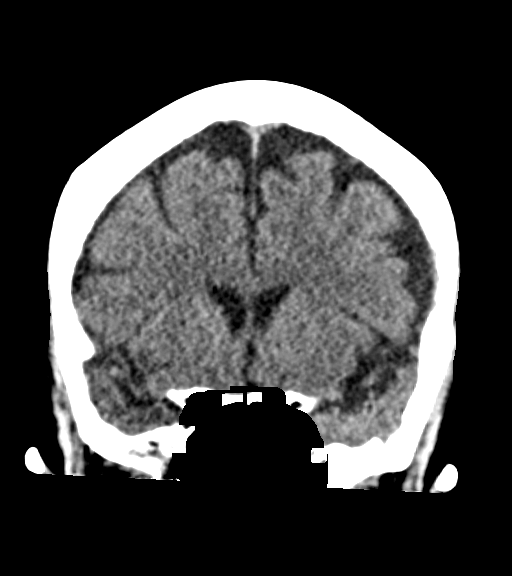
[im 30/66  brain]
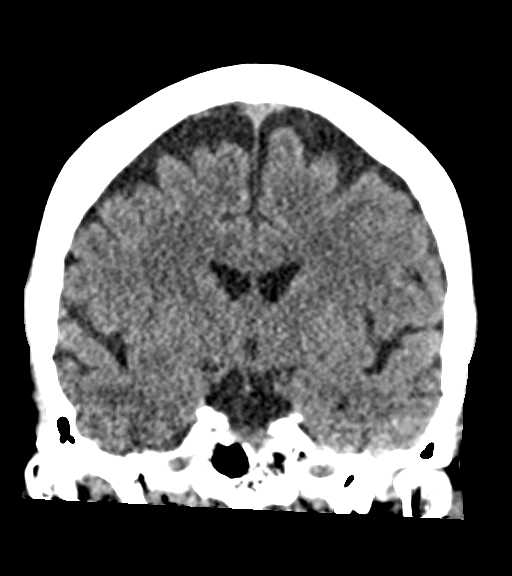
[im 36/66  brain]
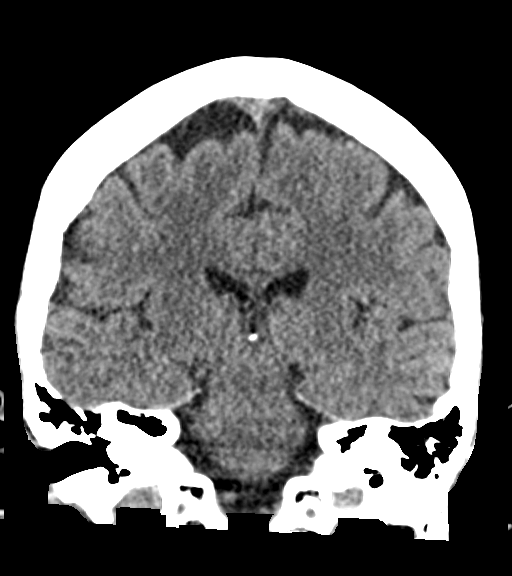

[Series 5: sagittal soft tissue · sagittal · 0.33mm/px · 3 of 49 slices shown]
[im 17/49  brain]
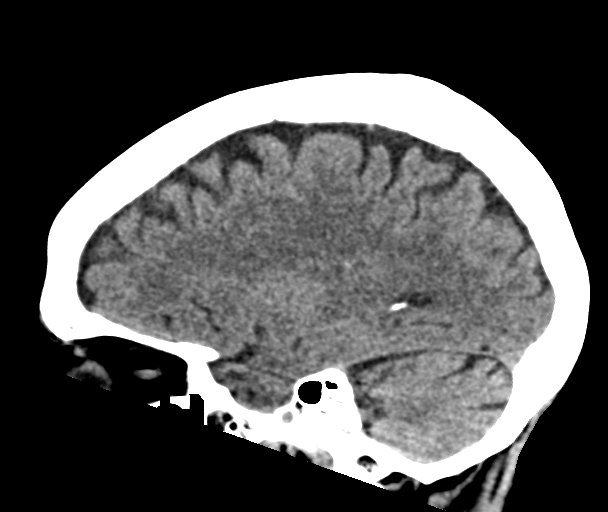
[im 25/49  brain]
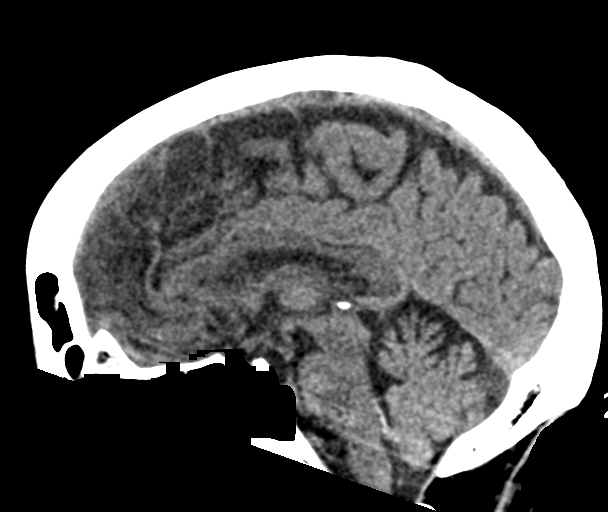
[im 33/49  brain]
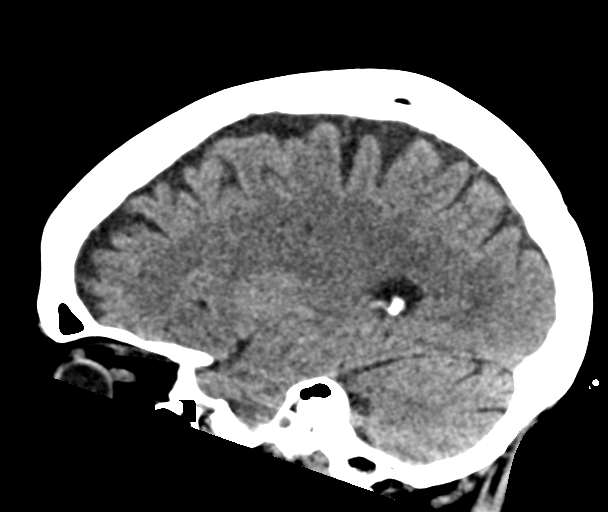

[16 of 47 positions shown; findings below may reference images not displayed]

FINDINGS: Brain: Mildly enlarged subarachnoid spaces without significant
change. Normal size and position of the ventricles. No intracranial
hemorrhage, mass lesion or CT evidence of acute infarction.

Vascular: No hyperdense vessel or unexpected calcification.

Skull: Normal. Negative for fracture or focal lesion.

Sinuses/Orbits: Unremarkable.

Other: None.
IMPRESSION: 1. No acute abnormality.
2. Stable mild cerebral and cerebellar atrophy.

## 2021-10-30 ENCOUNTER — Other Ambulatory Visit: Payer: Self-pay | Admitting: Physician Assistant

## 2021-10-30 DIAGNOSIS — Z1231 Encounter for screening mammogram for malignant neoplasm of breast: Secondary | ICD-10-CM

## 2022-12-27 ENCOUNTER — Other Ambulatory Visit: Payer: Self-pay

## 2022-12-27 ENCOUNTER — Emergency Department
Admission: EM | Admit: 2022-12-27 | Discharge: 2022-12-27 | Disposition: A | Discharge status: Home / Self Care | Payer: 59 | Attending: Emergency Medicine | Admitting: Emergency Medicine

## 2022-12-27 ENCOUNTER — Emergency Department: Payer: 59

## 2022-12-27 DIAGNOSIS — I10 Essential (primary) hypertension: Secondary | ICD-10-CM | POA: Diagnosis not present

## 2022-12-27 DIAGNOSIS — J45909 Unspecified asthma, uncomplicated: Secondary | ICD-10-CM | POA: Insufficient documentation

## 2022-12-27 DIAGNOSIS — J069 Acute upper respiratory infection, unspecified: Secondary | ICD-10-CM | POA: Diagnosis not present

## 2022-12-27 DIAGNOSIS — Z20822 Contact with and (suspected) exposure to covid-19: Secondary | ICD-10-CM | POA: Insufficient documentation

## 2022-12-27 DIAGNOSIS — J209 Acute bronchitis, unspecified: Secondary | ICD-10-CM | POA: Insufficient documentation

## 2022-12-27 DIAGNOSIS — R059 Cough, unspecified: Secondary | ICD-10-CM | POA: Diagnosis present

## 2022-12-27 LAB — CBC
HCT: 37.6 % (ref 36.0–46.0)
Hemoglobin: 12 g/dL (ref 12.0–15.0)
MCH: 25.3 pg — ABNORMAL LOW (ref 26.0–34.0)
MCHC: 31.9 g/dL (ref 30.0–36.0)
MCV: 79.2 fL — ABNORMAL LOW (ref 80.0–100.0)
Platelets: 217 10*3/uL (ref 150–400)
RBC: 4.75 MIL/uL (ref 3.87–5.11)
RDW: 15.2 % (ref 11.5–15.5)
WBC: 8.6 10*3/uL (ref 4.0–10.5)
nRBC: 0 % (ref 0.0–0.2)

## 2022-12-27 LAB — BASIC METABOLIC PANEL
Anion gap: 8 (ref 5–15)
BUN: 22 mg/dL (ref 8–23)
CO2: 25 mmol/L (ref 22–32)
Calcium: 8.9 mg/dL (ref 8.9–10.3)
Chloride: 107 mmol/L (ref 98–111)
Creatinine, Ser: 1.22 mg/dL — ABNORMAL HIGH (ref 0.44–1.00)
GFR, Estimated: 45 mL/min — ABNORMAL LOW (ref >60)
Glucose, Bld: 86 mg/dL (ref 70–99)
Potassium: 4.3 mmol/L (ref 3.5–5.1)
Sodium: 140 mmol/L (ref 135–145)

## 2022-12-27 LAB — RESP PANEL BY RT-PCR (RSV, FLU A&B, COVID)  RVPGX2
Influenza A by PCR: NEGATIVE
Influenza B by PCR: NEGATIVE
Resp Syncytial Virus by PCR: NEGATIVE
SARS Coronavirus 2 by RT PCR: NEGATIVE

## 2022-12-27 LAB — TROPONIN I (HIGH SENSITIVITY): Troponin I (High Sensitivity): 7 ng/L (ref <18)

## 2022-12-27 MED ORDER — BENZONATATE 100 MG PO CAPS: 100.0000 mg | ORAL_CAPSULE | Freq: Four times a day (QID) | ORAL | 0 refills | Status: AC | PRN

## 2022-12-27 MED ORDER — IPRATROPIUM-ALBUTEROL 0.5-2.5 (3) MG/3ML IN SOLN
3.0000 mL | Freq: Once | RESPIRATORY_TRACT | Status: AC
  Administered 2022-12-27: 3 mL via RESPIRATORY_TRACT
  Filled 2022-12-27: qty 3

## 2022-12-27 MED ORDER — AZITHROMYCIN 250 MG PO TABS: ORAL_TABLET | ORAL | 0 refills | Status: AC

## 2022-12-27 NOTE — ED Provider Notes (Signed)
[facility] Provider Note    Event Date/Time   First MD Initiated Contact with Patient 12/27/22 [phone]     (approximate)  History   Chief Complaint: Chest Pain  HPI  Amber Vasquez is a 82 y.o. female with a past medical history of asthma, hypertension, presents to the emergency department for continued cough and chest congestion.  According to the patient for the past week or so she has been coughing with sputum production.  States mild shortness of breath and chest tightness at times.  Patient states the cough has not gone away so she came to the emergency department for evaluation.  Patient is a daily smoker.  No fever.  No vomiting.  Denies any chest pain currently.  Physical Exam   Triage Vital Signs: ED Triage Vitals  Encounter Vitals Group     BP 12/27/22 0827 130/65     Systolic BP Percentile --      Diastolic BP Percentile --      Pulse Rate 12/27/22 0827 (!) 103     Resp 12/27/22 0827 18     Temp 12/27/22 0827 98.2 F (36.8 C)     Temp src --      SpO2 12/27/22 0827 100 %     Weight 12/27/22 0824 135 lb (61.2 kg)     Height 12/27/22 0824 5\' 9"  (1.753 m)     Head Circumference --      Peak Flow --      Pain Score 12/27/22 0824 8     Pain Loc --      Pain Education --      Exclude from Growth Chart --     Most recent vital signs: Vitals:   12/27/22 0827  BP: 130/65  Pulse: (!) 103  Resp: 18  Temp: 98.2 F (36.8 C)  SpO2: 100%    General: Awake, no distress.  CV:  Good peripheral perfusion.  Regular rate and rhythm  Resp:  Normal effort.  Equal breath sounds bilaterally.  Mild bilateral rhonchi that largely clears with coughing. Abd:  No distention.  Soft, nontender.  No rebound or guarding. Other:  Lower extremity edema or tenderness   ED Results / Procedures / Treatments   EKG  EKG viewed and interpreted by myself shows a normal sinus rhythm at 99 bpm with a narrow QRS, normal axis, normal intervals, no concerning ST  changes.  RADIOLOGY  I have reviewed and interpreted chest x-ray images.  No consolidation on my evaluation. Radiology is read the x-ray is negative.   MEDICATIONS ORDERED IN ED: Medications  ipratropium-albuterol (DUONEB) 0.5-2.5 (3) MG/3ML nebulizer solution 3 mL (has no administration in time range)  ipratropium-albuterol (DUONEB) 0.5-2.5 (3) MG/3ML nebulizer solution 3 mL (has no administration in time range)     IMPRESSION / MDM / ASSESSMENT AND PLAN / ED COURSE  I reviewed the triage vital signs and the nursing notes.  Patient's presentation is most consistent with acute presentation with potential threat to life or bodily function.  Patient presents to the emergency department for 1 week of cough congestion sputum production now with shortness of breath/chest tightness.  Overall the patient appears well.  Mild rhonchi on exam that largely clears with coughing.  We will dose to DuoNebs.  We will check labs including a CBC chemistry troponin and obtain a chest x-ray.  Will also obtain a COVID/flu test as a precaution.  Patient's labs have resulted showing a reassuring CBC with a  normal white blood cell count, reassuring chemistry and a negative troponin.  Chest x-ray is clear with no signs of pneumonia.  Patient's COVID/flu test is pending.  Differential would include upper respiratory infection, acute bronchitis, viral infection.  If the patient's COVID/flu test is negative anticipate likely discharge home on a short course of antibiotics to cover for acute bronchitis given 1 week of symptoms without resolution.  Patient's COVID/flu test is negative.  Given the patient's reassuring workup I believe the patient safer discharge home with outpatient follow-up.  We will cover with Zithromax as a precaution for acute bronchitis.  FINAL CLINICAL IMPRESSION(S) / ED DIAGNOSES   Upper respiratory infection Acute bronchitis  Note:  This document was prepared using Dragon voice recognition  software and may include unintentional dictation errors.   Minna Antis, MD 12/27/22 [phone]

## 2022-12-27 NOTE — ED Triage Notes (Signed)
Pt states coming into the ED with chest pain and body aches since last week. Pt reports chest pain is tightness to the center of the chest. Pt denies prior history of heart conditions.

## 2022-12-27 NOTE — Discharge Instructions (Addendum)
Please take your antibiotics as prescribed for their entire course.  Please follow-up with your doctor in 1 to 2 days for recheck/reevaluation.  Please take your cough medication as needed but only as prescribed.  Return to the emergency department for any chest pain any trouble breathing or any other symptom personally concerning to yourself.

## 2023-09-23 ENCOUNTER — Encounter: Payer: Self-pay | Admitting: Ophthalmology

## 2023-09-24 NOTE — Anesthesia Preprocedure Evaluation (Addendum)
 Anesthesia Evaluation  Patient identified by MRN, date of birth, ID band Patient awake    Reviewed: Allergy & Precautions, H&P , NPO status , Patient's Chart, lab work & pertinent test results  Airway Mallampati: II  TM Distance: >3 FB Neck ROM: Full    Dental no notable dental hx. (+) Upper Dentures, Lower Dentures   Pulmonary neg pulmonary ROS, asthma , pneumonia, former smoker   Pulmonary exam normal breath sounds clear to auscultation       Cardiovascular hypertension, negative cardio ROS Normal cardiovascular exam+ Valvular Problems/Murmurs  Rhythm:Regular Rate:Normal     Neuro/Psych negative neurological ROS  negative psych ROS   GI/Hepatic negative GI ROS, Neg liver ROS,,,  Endo/Other  negative endocrine ROS    Renal/GU negative Renal ROS  negative genitourinary   Musculoskeletal negative musculoskeletal ROS (+) Arthritis ,    Abdominal   Peds negative pediatric ROS (+)  Hematology negative hematology ROS (+)   Anesthesia Other Findings HTN Heart murmur Asthma Arthritis Lovely lady here with her very nice sister.  Reproductive/Obstetrics negative OB ROS                              Anesthesia Physical Anesthesia Plan  ASA: 2  Anesthesia Plan: MAC   Post-op Pain Management:    Induction: Intravenous  PONV Risk Score and Plan:   Airway Management Planned: Natural Airway and Nasal Cannula  Additional Equipment:   Intra-op Plan:   Post-operative Plan:   Informed Consent: I have reviewed the patients History and Physical, chart, labs and discussed the procedure including the risks, benefits and alternatives for the proposed anesthesia with the patient or authorized representative who has indicated his/her understanding and acceptance.     Dental Advisory Given  Plan Discussed with: Anesthesiologist, CRNA and Surgeon  Anesthesia Plan Comments: (Patient consented  for risks of anesthesia including but not limited to:  - adverse reactions to medications - damage to eyes, teeth, lips or other oral mucosa - nerve damage due to positioning  - sore throat or hoarseness - Damage to heart, brain, nerves, lungs, other parts of body or loss of life  Patient voiced understanding and assent.)         Anesthesia Quick Evaluation

## 2023-10-06 NOTE — Discharge Instructions (Signed)
 INSTRUCTIONS FOLLOWING OCULOPLASTIC SURGERY AMY EMERSON GAY, MD  AFTER YOUR EYE SURGERY, THER ARE MANY THINGS WHICH YOU, THE PATIENT, CAN DO TO ASSURE THE BEST POSSIBLE RESULT FROM YOUR OPERATION.  THIS SHEET SHOULD BE REFERRED TO WHENEVER QUESTIONS ARISE.  IF THERE ARE ANY QUESTIONS NOT ANSWERED HERE, DO NOT HESITATE TO CALL OUR OFFICE AT [phone] OR [phone].  THERE IS ALWAYS SOMEONE AVAILABLE TO CALL IF QUESTIONS OR PROBLEMS ARISE.  VISION: Your vision may be blurred and out of focus after surgery until you are able to stop using your ointment, swelling resolves and your eye(s) heal. This may take 1 to 2 weeks at the least.  If your vision becomes gradually more dim or dark, this is not normal and you need to call our office immediately.  EYE CARE: For the first 48 hours after surgery, use ice packs frequently - "20 minutes on, 20 minutes off" - to help reduce swelling and bruising.  Small bags of frozen peas or corn make good ice packs along with cloths soaked in ice water.  If you are wearing a patch or other type of dressing following surgery, keep this on for the amount of time specified by your doctor.  For the first week following surgery, you will need to treat your stitches with great care.  It is OK to shower, but take care to not allow soapy water to run into your eye(s) to help reduce chances of infection.  You may gently clean the eyelashes and around the eye(s) with cotton balls and bottled water, BUT DO NOT RUB THE STITCHES VIGOROUSLY.  Keeping your stitches moist with ointment will help promote healing with minimal scar formation.  ACTIVITY: When you leave the surgery center, you should go home, rest and be inactive.  The eye(s) may feel scratchy and keeping the eyes closed will allow for faster healing.  The first week following surgery, avoid straining (anything making the face turn red) or lifting over 20 pounds.  Additionally, avoid bending which causes your head to go below  your waist.  Using your eyes will NOT harm them, so feel free to read, watch television, use the computer, etc as desired.  Driving depends on each individual, so check with your doctor if you have questions about driving. Do not wear contact lenses for about 2 weeks.  Do not wear eye makeup for 2 weeks.  Avoid swimming, hot tubs, gardening, and dusting for 1 to 2 weeks to reduce the risk of an infection.  MEDICATIONS:  You will be given a prescription for an ointment to use 4 times a day on your stitches.  You can use the ointment in your eyes if they feel scratchy or irritated.  If you eyelid(s) don't close completely when you sleep, put some ointment in your eyes before bedtime.  EMERGENCY: If you experience SEVERE EYE PAIN OR HEADACHE UNRELIEVED BY TYLENOL OR TRAMADOL , NAUSEA OR VOMITING, WORSENING REDNESS, OR WORSENING VISION (ESPECIALLY VISION THAT WAS INITIALLY BETTER) CALL [phone] OR [phone] DURING BUSINESS HOURS OR AFTER HOURS.

## 2023-12-03 ENCOUNTER — Encounter: Payer: Self-pay | Admitting: Anesthesiology

## 2023-12-03 ENCOUNTER — Ambulatory Visit
Admission: RE | Admit: 2023-12-03 | Discharge: 2023-12-03 | Disposition: A | Discharge status: Home / Self Care | Attending: Ophthalmology | Admitting: Ophthalmology

## 2023-12-03 ENCOUNTER — Other Ambulatory Visit: Payer: Self-pay

## 2023-12-03 ENCOUNTER — Encounter
Admission: RE | Disposition: A | Discharge status: Home / Self Care | Payer: Self-pay | Source: Home / Self Care | Attending: Ophthalmology

## 2023-12-03 ENCOUNTER — Encounter: Payer: Self-pay | Admitting: Ophthalmology

## 2023-12-03 DIAGNOSIS — H02035 Senile entropion of left lower eyelid: Secondary | ICD-10-CM | POA: Diagnosis present

## 2023-12-03 DIAGNOSIS — H02045 Spastic entropion of left lower eyelid: Secondary | ICD-10-CM | POA: Diagnosis present

## 2023-12-03 DIAGNOSIS — Z87891 Personal history of nicotine dependence: Secondary | ICD-10-CM | POA: Diagnosis not present

## 2023-12-03 DIAGNOSIS — H02005 Unspecified entropion of left lower eyelid: Secondary | ICD-10-CM | POA: Insufficient documentation

## 2023-12-03 DIAGNOSIS — I10 Essential (primary) hypertension: Secondary | ICD-10-CM | POA: Insufficient documentation

## 2023-12-03 HISTORY — DX: Unspecified osteoarthritis, unspecified site: M19.90

## 2023-12-03 HISTORY — DX: Cardiac murmur, unspecified: R01.1

## 2023-12-03 HISTORY — PX: ENTROPIAN REPAIR: SHX1512

## 2023-12-03 SURGERY — REPAIR, ENTROPION
Anesthesia: Monitor Anesthesia Care | Laterality: Left

## 2023-12-03 MED ORDER — MIDAZOLAM HCL 2 MG/2ML IJ SOLN
INTRAMUSCULAR | Status: AC
  Filled 2023-12-03: qty 2

## 2023-12-03 MED ORDER — TETRACAINE HCL 0.5 % OP SOLN
OPHTHALMIC | Status: DC | PRN
  Administered 2023-12-03: 2 [drp] via OPHTHALMIC

## 2023-12-03 MED ORDER — LIDOCAINE HCL (CARDIAC) PF 100 MG/5ML IV SOSY
PREFILLED_SYRINGE | INTRAVENOUS | Status: DC | PRN
  Administered 2023-12-03: 60 mg via INTRAVENOUS

## 2023-12-03 MED ORDER — TRAMADOL HCL 50 MG PO TABS: ORAL_TABLET | ORAL | 0 refills | Status: AC

## 2023-12-03 MED ORDER — LIDOCAINE-EPINEPHRINE 2 %-1:100000 IJ SOLN
INTRAMUSCULAR | Status: DC | PRN
  Administered 2023-12-03: 2.5 mL via OPHTHALMIC

## 2023-12-03 MED ORDER — FENTANYL CITRATE (PF) 100 MCG/2ML IJ SOLN
INTRAMUSCULAR | Status: AC
  Filled 2023-12-03: qty 2

## 2023-12-03 MED ORDER — ERYTHROMYCIN 5 MG/GM OP OINT
TOPICAL_OINTMENT | OPHTHALMIC | Status: DC | PRN
  Administered 2023-12-03: 1 via OPHTHALMIC

## 2023-12-03 MED ORDER — ERYTHROMYCIN 5 MG/GM OP OINT
TOPICAL_OINTMENT | OPHTHALMIC | 2 refills | Status: AC
Start: 2023-12-03 — End: ?

## 2023-12-03 MED ORDER — LACTATED RINGERS IV SOLN: INTRAVENOUS | Status: DC

## 2023-12-03 MED ORDER — MIDAZOLAM HCL 2 MG/2ML IJ SOLN
INTRAMUSCULAR | Status: DC | PRN
  Administered 2023-12-03: 2 mg via INTRAVENOUS

## 2023-12-03 MED ORDER — PROPOFOL 500 MG/50ML IV EMUL
INTRAVENOUS | Status: DC | PRN
  Administered 2023-12-03: 40 ug/kg/min via INTRAVENOUS

## 2023-12-03 MED ORDER — BSS IO SOLN
INTRAOCULAR | Status: DC | PRN
  Administered 2023-12-03: 15 mL via INTRAOCULAR

## 2023-12-03 MED ORDER — LIDOCAINE HCL (PF) 2 % IJ SOLN
INTRAMUSCULAR | Status: AC
  Filled 2023-12-03: qty 5

## 2023-12-03 SURGICAL SUPPLY — 20 items
APPLICATOR COT TIP 3IN STRL (MISCELLANEOUS) ×2 IMPLANT
APPLICATOR COTTON TIP 3IN (MISCELLANEOUS) ×1 IMPLANT
BLADE SURG 15 STRL LF DISP TIS (BLADE) ×1 IMPLANT
CORD BIP STRL DISP 12FT (MISCELLANEOUS) ×1 IMPLANT
GAUZE SPONGE 2X2 STRL 8-PLY (GAUZE/BANDAGES/DRESSINGS) ×10 IMPLANT
GAUZE SPONGE 4X4 12PLY STRL (GAUZE/BANDAGES/DRESSINGS) ×1 IMPLANT
GLOVE SURG UNDER POLY LF SZ7 (GLOVE) ×2 IMPLANT
MARKER SKIN XFINE TIP W/RULER (MISCELLANEOUS) ×1 IMPLANT
NDL 18GX1X1/2 (RX/OR ONLY) (NEEDLE) ×1 IMPLANT
NDL HYPO 30X.5 LL (NEEDLE) ×2 IMPLANT
NEEDLE 18GX1X1/2 (RX/OR ONLY) (NEEDLE) ×1 IMPLANT
NEEDLE HYPO 30X.5 LL (NEEDLE) ×2 IMPLANT
PACK ENT CUSTOM (PACKS) ×1 IMPLANT
SOLUTION PREP PVP 2OZ (MISCELLANEOUS) ×1 IMPLANT
SUT GUT PLAIN 6-0 1X18 ABS (SUTURE) IMPLANT
SUT VICRYL 6-0 S14 CTD (SUTURE) IMPLANT
SUTURE CHRMC 4-0 M2 12X2 ARM (SUTURE) IMPLANT
SYR 10ML LL (SYRINGE) ×1 IMPLANT
SYR 3ML LL SCALE MARK (SYRINGE) ×1 IMPLANT
WATER STERILE IRR 250ML POUR (IV SOLUTION) ×1 IMPLANT

## 2023-12-03 NOTE — Interval H&P Note (Signed)
 History and Physical Interval Note:  12/03/2023 12:19 PM  Amber Vasquez  has presented today for surgery, with the diagnosis of Left Lower Entropion.  The various methods of treatment have been discussed with the patient and family. After consideration of risks, benefits and other options for treatment, the patient has consented to  Procedure(s): REPAIR, ENTROPION (Left) as a surgical intervention.  The patient's history has been reviewed, patient examined, no change in status, stable for surgery.  I have reviewed the patient's chart and labs.  Questions were answered to the patient's satisfaction.     Ashley, Chilton Sallade M

## 2023-12-03 NOTE — H&P (Signed)
 [facility]: [facility]  Primary Care Physician:  Marikay Eva POUR, [place] Ophthalmologist: Dr. Greig HERO. Ashley, M.D.  Pre-Procedure History & Physical: HPI:  Amber Vasquez is a 83 y.o. female here for periocular surgery.   Past Medical History:  Diagnosis Date   Arthritis    fingers   Asthma    Heart murmur    Hypertension     Past Surgical History:  Procedure Laterality Date   ABDOMINAL HYSTERECTOMY      Prior to Admission medications   Medication Sig Start Date End Date Taking? Authorizing Provider  ASPIRIN 81 PO Take by mouth daily.   Yes [provider]  Cyanocobalamin (B-12) 5000 MCG CAPS Take 2,500 mcg by mouth daily.   Yes [provider]  hydrochlorothiazide  (HYDRODIURIL ) 12.5 MG tablet Take 12.5 mg by mouth daily.   Yes [provider]  amLODipine  (NORVASC ) 10 MG tablet Take 1 tablet (10 mg total) by mouth daily. 03/22/18 03/22/19  Yvonna Alm Cough, MD  Ascorbic Acid  (VITAMIN C ) 1000 MG tablet Take 1,000 mg by mouth daily. Patient not taking: Reported on 09/23/2023    [provider]  benzonatate  (TESSALON  PERLES) 100 MG capsule Take 1 capsule (100 mg total) by mouth every 6 (six) hours as needed for cough. Patient not taking: Reported on 09/23/2023 12/27/22 12/27/23  Dorothyann Drivers, MD  ferrous sulfate  325 (65 FE) MG tablet Take 1 tablet (325 mg total) by mouth daily. Patient not taking: Reported on 09/23/2023 03/22/18 03/22/19  Yvonna Alm Cough, MD  Fluticasone -Umeclidin-Vilant (TRELEGY ELLIPTA) 100-62.5-25 MCG/INH AEPB Inhale 1 puff into the lungs daily. Patient not taking: Reported on 09/23/2023 12/08/17   Tamea Dedra CROME, MD  methylPREDNISolone  (MEDROL  DOSEPAK) 4 MG TBPK tablet Take 6 pills on day one then decrease by 1 pill each day Patient not taking: Reported on 09/23/2023 11/03/20   Gasper Devere ORN, PA-C    Allergies as of 09/16/2023   (No Known Allergies)    Family History  Problem Relation Age  of Onset   Hypertension Mother     Social History   Socioeconomic History   Marital status: Widowed    Spouse name: Not on file   Number of children: Not on file   Years of education: Not on file   Highest education level: Not on file  Occupational History   Not on file  Tobacco Use   Smoking status: Former    Current packs/day: 0.00    Types: Cigarettes    Quit date: 11/20/2017    Years since quitting: 6.0   Smokeless tobacco: Never  Vaping Use   Vaping status: Never Used  Substance and Sexual Activity   Alcohol use: Yes    Alcohol/week: 7.0 - 14.0 standard drinks of alcohol    Types: 7 - 14 Shots of liquor per week   Drug use: Never   Sexual activity: Not on file  Other Topics Concern   Not on file  Social History Narrative   Not on file   Social Drivers of Health   Financial Resource Strain: Not on file  Food Insecurity: Not on file  Transportation Needs: Not on file  Physical Activity: Not on file  Stress: Not on file  Social Connections: Not on file  Intimate Partner Violence: Not on file    Review of Systems: See HPI, otherwise negative ROS  Physical Exam: BP (!) 147/81   Temp (!) 97.1 F (36.2 C) (Temporal)   Resp 19   Ht  5' 7.99 (1.727 m)   Wt 61.6 kg   SpO2 100%   BMI 20.64 kg/m  General:   Alert and cooperative in NAD Head:  Normocephalic and atraumatic. Respiratory:  Normal work of breathing.  Impression/Plan: Amber Vasquez is here for periocular surgery.  Risks, benefits, limitations, and alternatives regarding surgery have been reviewed with the patient.  Questions have been answered.  All parties agreeable.   Ashley Greig CHRISTELLA, MD  12/03/2023, 12:19 PM

## 2023-12-03 NOTE — Op Note (Signed)
 Preoperative Diagnosis:   1.  Lower eyelid laxity with entropion, left  lower eyelid(s). 2.  Spastic Entropion left  Lower eyelid(s)  Postoperative Diagnosis:   Same.  Procedure(s) Performed:  1.  Lateral canthopexy procedure,  left   lower eyelid(s). 2.  Quickert Sutures for entropion left  lower eyelid(s)  Surgeon: Greig HERO. Ashley, M.D.  Assistants: none  Anesthesia: MAC  Specimens: None.  Estimated Blood Loss: Minimal.  Complications: None.  Operative Findings: None   Procedure:   Allergies were reviewed and the patient Patient has no known allergies..    After discussing the risks, benefits, complications, and alternatives with the patient, appropriate informed consent was obtained. The patient was brought to the operating suite and reclined supine. Time out was conducted and the patient was sedated.  Local anesthetic consisting of a 50-50 mixture of 2% lidocaine with epinephrine and 0.75% bupivacaine with added Hylenex was injected subcutaneously to the left  lateral canthal region(s) and lower eyelid(s). Additional anesthetic was injected subconjunctivally to the left  lower eyelid(s). Finally, anesthetic was injected down to the periosteum of the left  lateral orbital rim(s).  After adequate local was instilled, the patient was prepped and draped in the usual sterile fashion for eyelid surgery. Attention was turned to the left  lateral canthal angle. Westcott scissors were used to create a lateral canthotomy. Hemostasis was obtained with bipolar cautery. An inferior cantholysis was then performed with additional bipolar hemostasis. The anterior and posterior lamella of the lid were divided for approximately 8 mm.  A strip of the epithelium was excised off the superior margin of the tarsal strip and conjunctiva and retractors were incised off the inferior margin of the tarsal strip.   Two double-armed 4-0 chromic sutures were passed medially and laterally through the lower lid  from the conjunctival fornix, anteriorly through the skin just below the border of the tarsal plate. Each of these sutures was tied off and this created a nice outward rolling of the lid margin.  A double-armed 4-0 Mersilene suture was then passed each arm through the terminal portion of the tarsal strip. Each arm of the suture was then passed through the periosteum of the inner portion of the lateral orbital rim at the level of Whitnall's tubercle. The sutures were advanced and this provided nice elevation and tightening of the lower eyelid. Once the suture was secured, a thin strip of follicle-bearing skin was excised. The lateral canthal angle was reformed with an interrupted 6-0 plain gut suture. Orbicularis was reapproximated with horizontal subcuticular 6-0 plain gut sutures. The skin was closed with interrupted 6-0 plain gut sutures.    The patient tolerated the procedure well. Erythromycin ophthalmic ointment was applied to the incision site(s) followed by ice packs. The patient was taken to the recovery area where she recovered without difficulty.  Post-Op Plan/Instructions:   The patient was instructed to use ice packs frequently for the next 48 hours. She was instructed to use Erythromycin ophthalmic ointment on her incisions 4 times a day for the next 12 to 14 days. She was given a prescription for tramadol  (or similar) for pain control should Tylenol  not be effective. She was asked to to follow up at the [facility] in [place], [place] in 3-4 weeks' time or sooner as needed for problems.   Raudel Bazen M. Ashley, M.D. Ophthalmology

## 2023-12-03 NOTE — Anesthesia Postprocedure Evaluation (Signed)
 Anesthesia Post Note  Patient: Amber Vasquez  Procedure(s) Performed: REPAIR, ENTROPION (Left)  Patient location during evaluation: PACU Anesthesia Type: MAC Level of consciousness: awake and alert Pain management: pain level controlled Vital Signs Assessment: post-procedure vital signs reviewed and stable Respiratory status: spontaneous breathing, nonlabored ventilation, respiratory function stable and patient connected to nasal cannula oxygen Cardiovascular status: blood pressure returned to baseline and stable Postop Assessment: no apparent nausea or vomiting Anesthetic complications: no   No notable events documented.   Last Vitals:  Vitals:   12/03/23 1315 12/03/23 1320  BP: (!) 156/75 (!) 156/75  Pulse: 85 87  Resp: 16 16  Temp:  36.7 C  SpO2: 100% 100%    Last Pain:  Vitals:   12/03/23 1320  TempSrc:   PainSc: 0-No pain                 Carel Schnee C Gloria Lambertson

## 2023-12-03 NOTE — Transfer of Care (Signed)
 Immediate Anesthesia Transfer of Care Note  Patient: Amber Vasquez  Procedure(s) Performed: REPAIR, ENTROPION (Left)  Patient Location: PACU  Anesthesia Type: MAC  Level of Consciousness: awake, alert  and patient cooperative  Airway and Oxygen Therapy: Patient Spontanous Breathing and Patient connected to supplemental oxygen  Post-op Assessment: Post-op Vital signs reviewed, Patient's Cardiovascular Status Stable, Respiratory Function Stable, Patent Airway and No signs of Nausea or vomiting  Post-op Vital Signs: Reviewed and stable  Complications: No notable events documented.

## 2023-12-03 NOTE — Interval H&P Note (Signed)
 History and Physical Interval Note:  12/03/2023 12:28 PM  Amber Vasquez  has presented today for surgery, with the diagnosis of Left Lower Entropion.  The various methods of treatment have been discussed with the patient and family. After consideration of risks, benefits and other options for treatment, the patient has consented to  Procedure(s): REPAIR, ENTROPION (Left) as a surgical intervention.  The patient's history has been reviewed, patient examined, no change in status, stable for surgery.  I have reviewed the patient's chart and labs.  Questions were answered to the patient's satisfaction.    The patient 's heart exam is WNL  Ashley, Virginia M
